# Patient Record
Sex: Female | Born: 1955 | Race: Black or African American | Hispanic: No | Marital: Married | Smoking: Never smoker
Health system: Southern US, Community
[De-identification: ages and names within clinical notes are randomized; demographics above are authoritative.]

## PROBLEM LIST (undated history)

## (undated) DIAGNOSIS — D126 Benign neoplasm of colon, unspecified: Secondary | ICD-10-CM

## (undated) DIAGNOSIS — I10 Essential (primary) hypertension: Secondary | ICD-10-CM

## (undated) DIAGNOSIS — M1990 Unspecified osteoarthritis, unspecified site: Secondary | ICD-10-CM

## (undated) DIAGNOSIS — T7840XA Allergy, unspecified, initial encounter: Secondary | ICD-10-CM

## (undated) DIAGNOSIS — E785 Hyperlipidemia, unspecified: Secondary | ICD-10-CM

## (undated) DIAGNOSIS — N920 Excessive and frequent menstruation with regular cycle: Secondary | ICD-10-CM

## (undated) DIAGNOSIS — N189 Chronic kidney disease, unspecified: Secondary | ICD-10-CM

## (undated) HISTORY — PX: POLYPECTOMY: SHX149

## (undated) HISTORY — DX: Chronic kidney disease, unspecified: N18.9

## (undated) HISTORY — DX: Essential (primary) hypertension: I10

## (undated) HISTORY — DX: Allergy, unspecified, initial encounter: T78.40XA

## (undated) HISTORY — DX: Benign neoplasm of colon, unspecified: D12.6

## (undated) HISTORY — DX: Unspecified osteoarthritis, unspecified site: M19.90

## (undated) HISTORY — PX: CYSTOSCOPY: SUR368

## (undated) HISTORY — DX: Excessive and frequent menstruation with regular cycle: N92.0

## (undated) HISTORY — DX: Hyperlipidemia, unspecified: E78.5

## (undated) HISTORY — PX: ABDOMINAL HYSTERECTOMY: SHX81

---

## 1998-02-08 ENCOUNTER — Ambulatory Visit (HOSPITAL_COMMUNITY): Admission: RE | Admit: 1998-02-08 | Discharge: 1998-02-08 | Payer: Self-pay | Admitting: Obstetrics & Gynecology

## 1998-02-27 ENCOUNTER — Encounter: Admission: RE | Admit: 1998-02-27 | Discharge: 1998-02-27 | Payer: Self-pay | Admitting: Obstetrics & Gynecology

## 1998-03-08 ENCOUNTER — Encounter: Admission: RE | Admit: 1998-03-08 | Discharge: 1998-03-08 | Payer: Self-pay | Admitting: Internal Medicine

## 1998-03-15 ENCOUNTER — Encounter: Admission: RE | Admit: 1998-03-15 | Discharge: 1998-03-15 | Payer: Self-pay | Admitting: Hematology and Oncology

## 1998-04-19 ENCOUNTER — Encounter: Admission: RE | Admit: 1998-04-19 | Discharge: 1998-04-19 | Payer: Self-pay | Admitting: Internal Medicine

## 1998-04-24 ENCOUNTER — Encounter: Admission: RE | Admit: 1998-04-24 | Discharge: 1998-04-24 | Payer: Self-pay | Admitting: Obstetrics & Gynecology

## 1998-05-17 ENCOUNTER — Encounter: Admission: RE | Admit: 1998-05-17 | Discharge: 1998-05-17 | Payer: Self-pay | Admitting: Internal Medicine

## 1998-05-31 ENCOUNTER — Encounter: Admission: RE | Admit: 1998-05-31 | Discharge: 1998-05-31 | Payer: Self-pay | Admitting: Hematology and Oncology

## 1998-06-19 ENCOUNTER — Encounter: Admission: RE | Admit: 1998-06-19 | Discharge: 1998-09-17 | Payer: Self-pay | Admitting: *Deleted

## 1998-07-05 ENCOUNTER — Encounter: Admission: RE | Admit: 1998-07-05 | Discharge: 1998-07-05 | Payer: Self-pay | Admitting: Internal Medicine

## 1998-08-14 ENCOUNTER — Encounter: Admission: RE | Admit: 1998-08-14 | Discharge: 1998-08-14 | Payer: Self-pay | Admitting: Hematology and Oncology

## 1998-12-18 ENCOUNTER — Ambulatory Visit (HOSPITAL_COMMUNITY): Admission: RE | Admit: 1998-12-18 | Discharge: 1998-12-18 | Payer: Self-pay | Admitting: Internal Medicine

## 1998-12-18 ENCOUNTER — Encounter: Admission: RE | Admit: 1998-12-18 | Discharge: 1998-12-18 | Payer: Self-pay | Admitting: Internal Medicine

## 1998-12-26 ENCOUNTER — Ambulatory Visit (HOSPITAL_COMMUNITY): Admission: RE | Admit: 1998-12-26 | Discharge: 1998-12-26 | Payer: Self-pay | Admitting: Internal Medicine

## 1999-01-17 ENCOUNTER — Encounter: Admission: RE | Admit: 1999-01-17 | Discharge: 1999-01-17 | Payer: Self-pay | Admitting: Internal Medicine

## 1999-02-21 ENCOUNTER — Ambulatory Visit (HOSPITAL_COMMUNITY): Admission: RE | Admit: 1999-02-21 | Discharge: 1999-02-21 | Payer: Self-pay | Admitting: Obstetrics & Gynecology

## 1999-05-02 ENCOUNTER — Encounter: Admission: RE | Admit: 1999-05-02 | Discharge: 1999-05-02 | Payer: Self-pay | Admitting: Obstetrics

## 1999-08-15 ENCOUNTER — Inpatient Hospital Stay (HOSPITAL_COMMUNITY): Admission: EM | Admit: 1999-08-15 | Discharge: 1999-08-16 | Payer: Self-pay | Admitting: Emergency Medicine

## 1999-08-15 ENCOUNTER — Encounter: Payer: Self-pay | Admitting: Emergency Medicine

## 1999-08-30 ENCOUNTER — Encounter: Admission: RE | Admit: 1999-08-30 | Discharge: 1999-08-30 | Payer: Self-pay | Admitting: Internal Medicine

## 2000-02-07 ENCOUNTER — Encounter: Admission: RE | Admit: 2000-02-07 | Discharge: 2000-02-07 | Payer: Self-pay | Admitting: Hematology and Oncology

## 2000-03-13 ENCOUNTER — Encounter: Admission: RE | Admit: 2000-03-13 | Discharge: 2000-03-13 | Payer: Self-pay | Admitting: Internal Medicine

## 2000-10-21 ENCOUNTER — Encounter: Admission: RE | Admit: 2000-10-21 | Discharge: 2000-10-21 | Payer: Self-pay | Admitting: Internal Medicine

## 2001-10-28 ENCOUNTER — Encounter: Admission: RE | Admit: 2001-10-28 | Discharge: 2001-10-28 | Payer: Self-pay | Admitting: Internal Medicine

## 2002-04-29 ENCOUNTER — Encounter: Admission: RE | Admit: 2002-04-29 | Discharge: 2002-04-29 | Payer: Self-pay | Admitting: Internal Medicine

## 2002-07-21 ENCOUNTER — Other Ambulatory Visit: Admission: RE | Admit: 2002-07-21 | Discharge: 2002-07-21 | Payer: Self-pay | Admitting: Obstetrics and Gynecology

## 2002-08-04 ENCOUNTER — Ambulatory Visit (HOSPITAL_COMMUNITY): Admission: RE | Admit: 2002-08-04 | Discharge: 2002-08-04 | Payer: Self-pay | Admitting: Obstetrics and Gynecology

## 2002-08-04 ENCOUNTER — Encounter: Payer: Self-pay | Admitting: Obstetrics and Gynecology

## 2002-09-01 ENCOUNTER — Encounter (INDEPENDENT_AMBULATORY_CARE_PROVIDER_SITE_OTHER): Payer: Self-pay

## 2002-09-01 ENCOUNTER — Ambulatory Visit (HOSPITAL_COMMUNITY): Admission: RE | Admit: 2002-09-01 | Discharge: 2002-09-01 | Payer: Self-pay | Admitting: Obstetrics and Gynecology

## 2002-12-07 ENCOUNTER — Encounter: Admission: RE | Admit: 2002-12-07 | Discharge: 2002-12-07 | Payer: Self-pay | Admitting: Internal Medicine

## 2003-04-19 ENCOUNTER — Encounter: Admission: RE | Admit: 2003-04-19 | Discharge: 2003-04-19 | Payer: Self-pay | Admitting: Internal Medicine

## 2003-11-18 DIAGNOSIS — D126 Benign neoplasm of colon, unspecified: Secondary | ICD-10-CM

## 2003-11-18 HISTORY — DX: Benign neoplasm of colon, unspecified: D12.6

## 2004-01-26 ENCOUNTER — Encounter: Admission: RE | Admit: 2004-01-26 | Discharge: 2004-01-26 | Payer: Self-pay | Admitting: Internal Medicine

## 2004-02-01 ENCOUNTER — Encounter: Admission: RE | Admit: 2004-02-01 | Discharge: 2004-02-01 | Payer: Self-pay | Admitting: Internal Medicine

## 2004-05-31 ENCOUNTER — Encounter: Admission: RE | Admit: 2004-05-31 | Discharge: 2004-05-31 | Payer: Self-pay | Admitting: Internal Medicine

## 2004-06-14 ENCOUNTER — Ambulatory Visit: Payer: Self-pay | Admitting: Internal Medicine

## 2004-06-14 ENCOUNTER — Encounter: Admission: RE | Admit: 2004-06-14 | Discharge: 2004-06-14 | Payer: Self-pay | Admitting: Internal Medicine

## 2004-07-03 ENCOUNTER — Other Ambulatory Visit: Admission: RE | Admit: 2004-07-03 | Discharge: 2004-07-03 | Payer: Self-pay | Admitting: Obstetrics and Gynecology

## 2004-07-09 ENCOUNTER — Ambulatory Visit (HOSPITAL_COMMUNITY): Admission: RE | Admit: 2004-07-09 | Discharge: 2004-07-09 | Payer: Self-pay | Admitting: Obstetrics and Gynecology

## 2004-10-07 ENCOUNTER — Ambulatory Visit (HOSPITAL_COMMUNITY): Admission: RE | Admit: 2004-10-07 | Discharge: 2004-10-07 | Payer: Self-pay | Admitting: Internal Medicine

## 2004-10-07 ENCOUNTER — Ambulatory Visit: Payer: Self-pay | Admitting: Internal Medicine

## 2005-06-09 IMAGING — CR DG FOOT COMPLETE 3+V*R*
3 series · 3 of 3 positions shown · non-contrast
Comparison: none

CLINICAL DATA: pain in right heel.
 RIGHT FOOT-COMPLETE:
 Three views of the right foot reveal degenerative changes without evidence of fracture or other acute abnormality.  A prominent inferior calcaneal spur is noted. There are arterial calcifications noted involving both the dorsalis pedis and the posterior tibial arteries.

[view not recorded (1 of 3)]
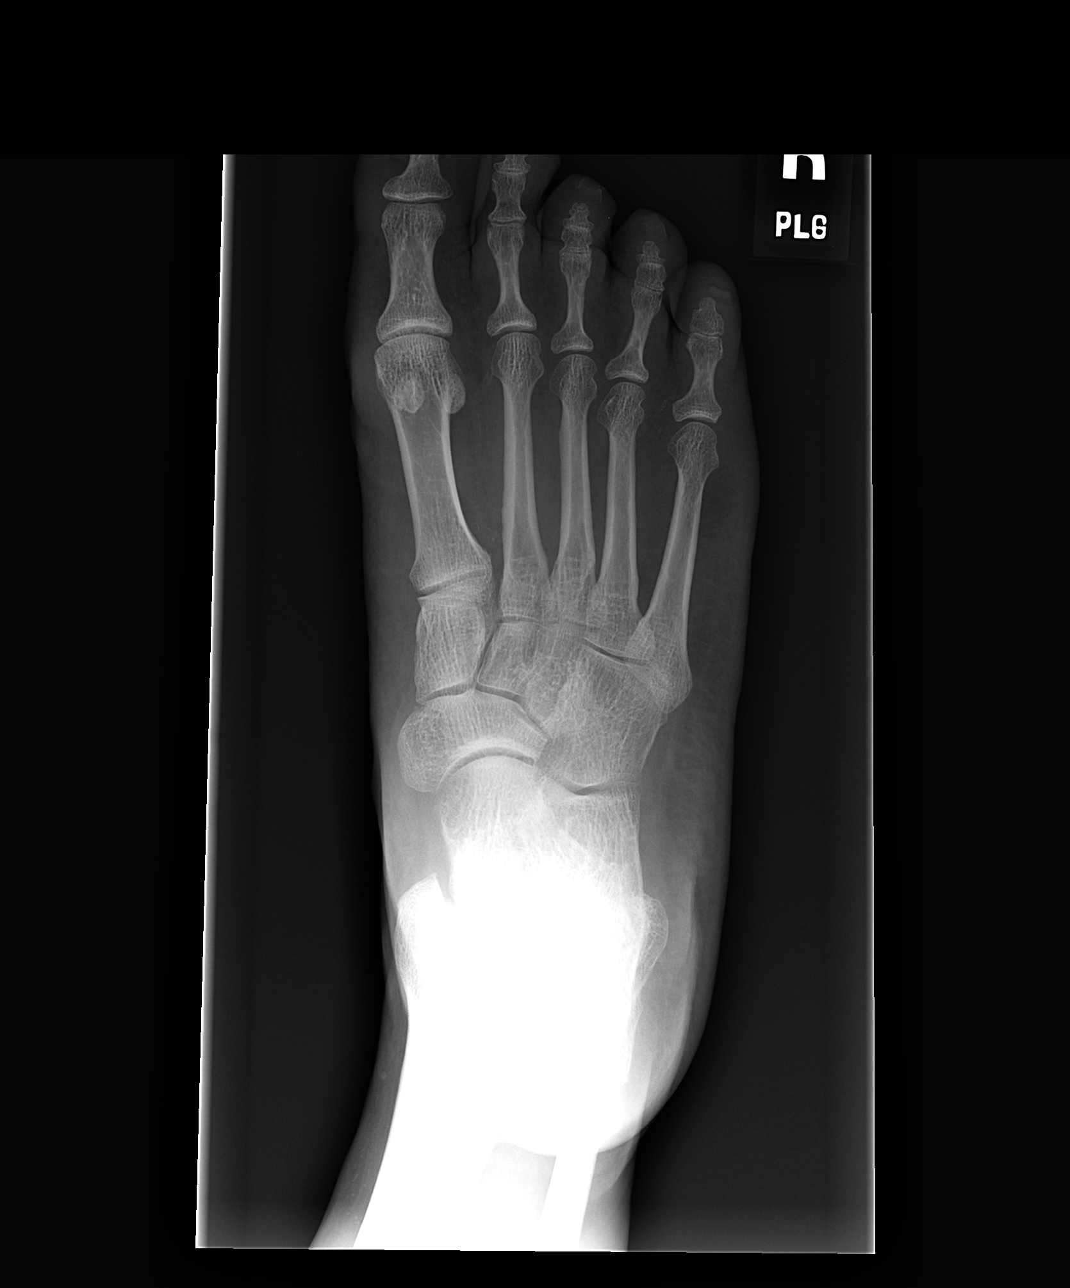

[view not recorded (2 of 3)]
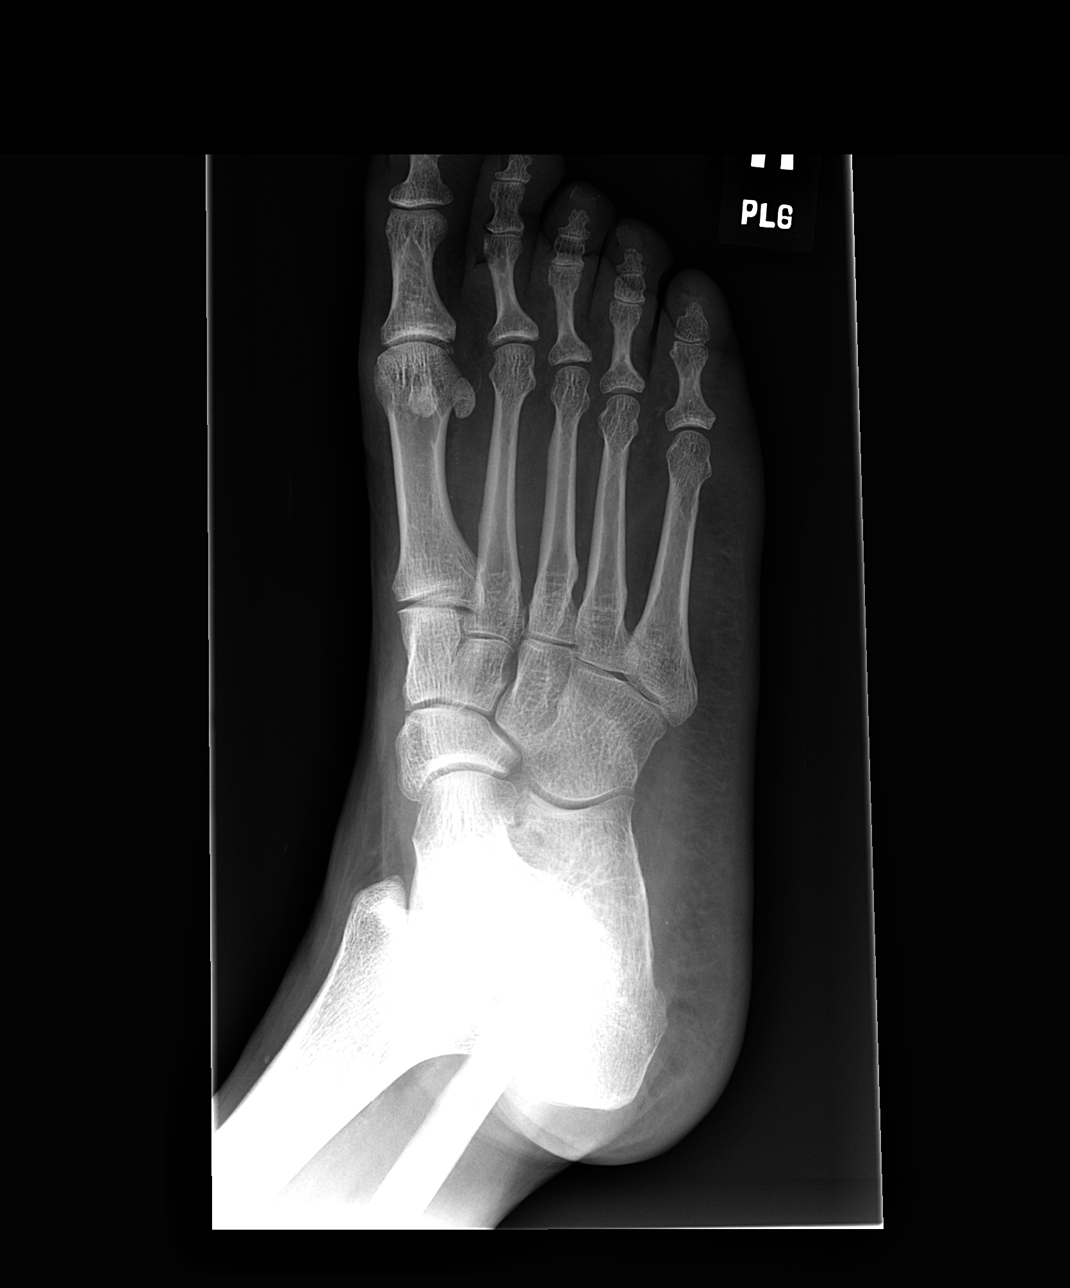

[view not recorded (3 of 3)]
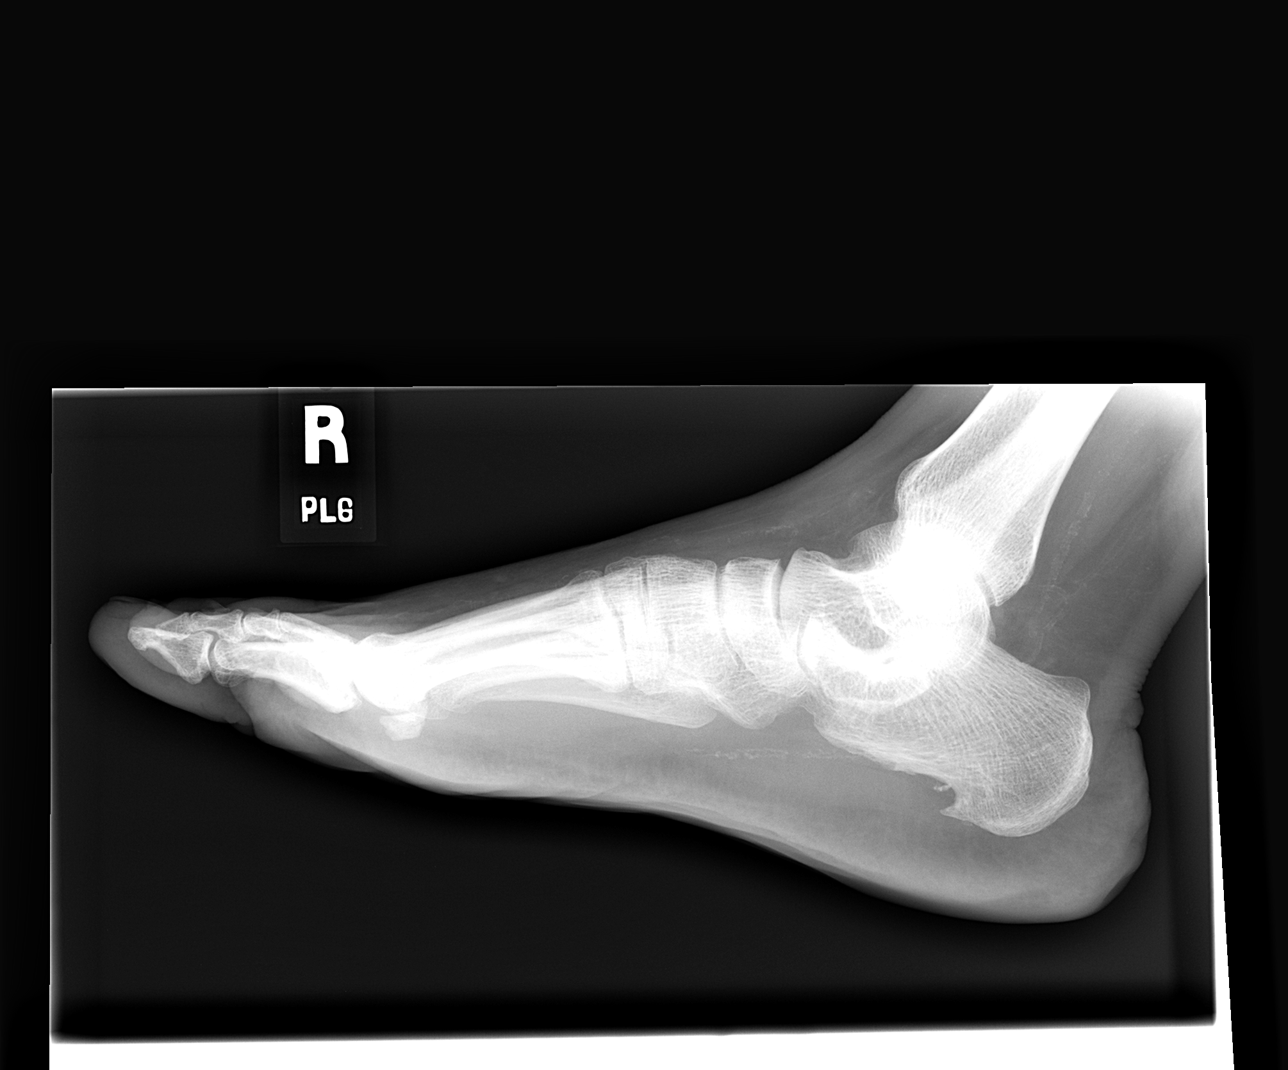

[3 of 3 positions shown; findings below may reference images not displayed]

IMPRESSION: Degenerative changes are noted with prominent inferior calcaneal spur, but without acute bony abnormality.  Arterial calcifications are noted.

## 2005-09-24 ENCOUNTER — Other Ambulatory Visit: Admission: RE | Admit: 2005-09-24 | Discharge: 2005-09-24 | Payer: Self-pay | Admitting: Obstetrics and Gynecology

## 2005-10-23 ENCOUNTER — Ambulatory Visit (HOSPITAL_COMMUNITY): Admission: RE | Admit: 2005-10-23 | Discharge: 2005-10-23 | Payer: Self-pay | Admitting: Obstetrics and Gynecology

## 2006-01-06 ENCOUNTER — Ambulatory Visit: Payer: Self-pay | Admitting: Internal Medicine

## 2006-01-14 ENCOUNTER — Ambulatory Visit: Payer: Self-pay | Admitting: Internal Medicine

## 2006-02-10 ENCOUNTER — Ambulatory Visit: Payer: Self-pay | Admitting: Internal Medicine

## 2006-11-11 ENCOUNTER — Ambulatory Visit (HOSPITAL_COMMUNITY): Admission: RE | Admit: 2006-11-11 | Discharge: 2006-11-11 | Payer: Self-pay | Admitting: Obstetrics and Gynecology

## 2007-04-07 ENCOUNTER — Encounter (INDEPENDENT_AMBULATORY_CARE_PROVIDER_SITE_OTHER): Payer: Self-pay | Admitting: Internal Medicine

## 2007-04-07 ENCOUNTER — Ambulatory Visit: Payer: Self-pay | Admitting: Hospitalist

## 2007-04-07 DIAGNOSIS — I119 Hypertensive heart disease without heart failure: Secondary | ICD-10-CM | POA: Insufficient documentation

## 2007-04-07 DIAGNOSIS — I10 Essential (primary) hypertension: Secondary | ICD-10-CM | POA: Insufficient documentation

## 2007-04-07 DIAGNOSIS — N920 Excessive and frequent menstruation with regular cycle: Secondary | ICD-10-CM | POA: Insufficient documentation

## 2007-04-07 DIAGNOSIS — E785 Hyperlipidemia, unspecified: Secondary | ICD-10-CM | POA: Insufficient documentation

## 2007-04-07 DIAGNOSIS — E119 Type 2 diabetes mellitus without complications: Secondary | ICD-10-CM | POA: Insufficient documentation

## 2007-04-08 ENCOUNTER — Encounter (INDEPENDENT_AMBULATORY_CARE_PROVIDER_SITE_OTHER): Payer: Self-pay | Admitting: Internal Medicine

## 2007-04-08 ENCOUNTER — Ambulatory Visit: Payer: Self-pay | Admitting: Internal Medicine

## 2007-04-08 LAB — CONVERTED CEMR LAB
Creatinine, Urine: 149.4 mg/dL
Microalb, Ur: 3.53 mg/dL — ABNORMAL HIGH (ref 0.00–1.89)

## 2007-04-11 LAB — CONVERTED CEMR LAB
ALT: 16 units/L (ref 0–35)
BUN: 13 mg/dL (ref 6–23)
Calcium: 9.7 mg/dL (ref 8.4–10.5)
Cholesterol: 144 mg/dL (ref 0–200)
Creatinine, Ser: 0.7 mg/dL (ref 0.40–1.20)
Glucose, Bld: 226 mg/dL — ABNORMAL HIGH (ref 70–99)
HDL: 42 mg/dL (ref >39)
Hemoglobin: 12.5 g/dL (ref 12.0–15.0)
LDL Cholesterol: 72 mg/dL (ref 0–99)
Platelets: 242 10*3/uL (ref 150–400)
RBC: 5.22 M/uL — ABNORMAL HIGH (ref 3.87–5.11)
RDW: 15.8 % — ABNORMAL HIGH (ref 11.5–14.0)
Total Protein: 7.6 g/dL (ref 6.0–8.3)
Triglycerides: 150 mg/dL — ABNORMAL HIGH (ref <150)

## 2007-04-13 ENCOUNTER — Ambulatory Visit: Payer: Self-pay | Admitting: Internal Medicine

## 2007-04-13 LAB — CONVERTED CEMR LAB

## 2007-04-19 ENCOUNTER — Telehealth: Payer: Self-pay | Admitting: *Deleted

## 2007-05-19 ENCOUNTER — Encounter (INDEPENDENT_AMBULATORY_CARE_PROVIDER_SITE_OTHER): Payer: Self-pay | Admitting: Internal Medicine

## 2007-05-19 ENCOUNTER — Ambulatory Visit: Payer: Self-pay | Admitting: Internal Medicine

## 2007-05-20 ENCOUNTER — Encounter (INDEPENDENT_AMBULATORY_CARE_PROVIDER_SITE_OTHER): Payer: Self-pay | Admitting: Obstetrics and Gynecology

## 2007-05-20 ENCOUNTER — Inpatient Hospital Stay (HOSPITAL_COMMUNITY): Admission: RE | Admit: 2007-05-20 | Discharge: 2007-05-21 | Payer: Self-pay | Admitting: Obstetrics and Gynecology

## 2007-09-29 ENCOUNTER — Telehealth (INDEPENDENT_AMBULATORY_CARE_PROVIDER_SITE_OTHER): Payer: Self-pay | Admitting: *Deleted

## 2007-10-21 ENCOUNTER — Ambulatory Visit: Payer: Self-pay | Admitting: Internal Medicine

## 2007-10-21 DIAGNOSIS — J309 Allergic rhinitis, unspecified: Secondary | ICD-10-CM | POA: Insufficient documentation

## 2007-10-21 DIAGNOSIS — D509 Iron deficiency anemia, unspecified: Secondary | ICD-10-CM | POA: Insufficient documentation

## 2007-10-25 ENCOUNTER — Ambulatory Visit: Payer: Self-pay | Admitting: Internal Medicine

## 2007-10-25 ENCOUNTER — Encounter (INDEPENDENT_AMBULATORY_CARE_PROVIDER_SITE_OTHER): Payer: Self-pay | Admitting: Internal Medicine

## 2007-10-26 LAB — CONVERTED CEMR LAB
Cholesterol: 187 mg/dL (ref 0–200)
HDL: 42 mg/dL (ref >39)
LDL Cholesterol: 121 mg/dL — ABNORMAL HIGH (ref 0–99)
VLDL: 24 mg/dL (ref 0–40)

## 2007-12-21 ENCOUNTER — Ambulatory Visit (HOSPITAL_COMMUNITY): Admission: RE | Admit: 2007-12-21 | Discharge: 2007-12-21 | Payer: Self-pay | Admitting: Obstetrics and Gynecology

## 2008-04-09 ENCOUNTER — Emergency Department (HOSPITAL_COMMUNITY): Admission: EM | Admit: 2008-04-09 | Discharge: 2008-04-09 | Payer: Self-pay | Admitting: Family Medicine

## 2008-11-07 ENCOUNTER — Telehealth: Payer: Self-pay | Admitting: Infectious Diseases

## 2008-12-12 ENCOUNTER — Ambulatory Visit: Payer: Self-pay | Admitting: Internal Medicine

## 2008-12-12 ENCOUNTER — Encounter (INDEPENDENT_AMBULATORY_CARE_PROVIDER_SITE_OTHER): Payer: Self-pay | Admitting: Internal Medicine

## 2008-12-13 ENCOUNTER — Encounter (INDEPENDENT_AMBULATORY_CARE_PROVIDER_SITE_OTHER): Payer: Self-pay | Admitting: Internal Medicine

## 2008-12-13 LAB — CONVERTED CEMR LAB: Microalb Creat Ratio: 19.7 mg/g (ref 0.0–30.0)

## 2008-12-14 ENCOUNTER — Ambulatory Visit: Payer: Self-pay | Admitting: Internal Medicine

## 2008-12-14 ENCOUNTER — Encounter (INDEPENDENT_AMBULATORY_CARE_PROVIDER_SITE_OTHER): Payer: Self-pay | Admitting: Internal Medicine

## 2008-12-14 LAB — CONVERTED CEMR LAB: Hgb A1c MFr Bld: 9.7 %

## 2008-12-16 LAB — CONVERTED CEMR LAB
ALT: 12 units/L (ref 0–35)
AST: 17 units/L (ref 0–37)
Calcium: 9.1 mg/dL (ref 8.4–10.5)
HCT: 39.1 % (ref 36.0–46.0)
Hemoglobin: 12.4 g/dL (ref 12.0–15.0)
MCV: 74.5 fL — ABNORMAL LOW (ref 78.0–100.0)
Potassium: 4 meq/L (ref 3.5–5.3)
RBC: 5.25 M/uL — ABNORMAL HIGH (ref 3.87–5.11)
RDW: 15.7 % — ABNORMAL HIGH (ref 11.5–15.5)
Sodium: 143 meq/L (ref 135–145)
Total Bilirubin: 0.4 mg/dL (ref 0.3–1.2)
Total CHOL/HDL Ratio: 4.3
WBC: 5.4 10*3/uL (ref 4.0–10.5)

## 2008-12-29 ENCOUNTER — Ambulatory Visit (HOSPITAL_COMMUNITY): Admission: RE | Admit: 2008-12-29 | Discharge: 2008-12-29 | Payer: Self-pay | Admitting: Internal Medicine

## 2009-01-18 ENCOUNTER — Ambulatory Visit: Payer: Self-pay | Admitting: *Deleted

## 2009-01-18 LAB — CONVERTED CEMR LAB: Hgb A1c MFr Bld: 9.3 %

## 2009-10-23 ENCOUNTER — Encounter (INDEPENDENT_AMBULATORY_CARE_PROVIDER_SITE_OTHER): Payer: Self-pay | Admitting: Internal Medicine

## 2009-10-23 ENCOUNTER — Ambulatory Visit: Payer: Self-pay | Admitting: Internal Medicine

## 2009-10-23 ENCOUNTER — Ambulatory Visit (HOSPITAL_COMMUNITY): Admission: RE | Admit: 2009-10-23 | Discharge: 2009-10-23 | Payer: Self-pay | Admitting: Internal Medicine

## 2009-10-23 DIAGNOSIS — R079 Chest pain, unspecified: Secondary | ICD-10-CM | POA: Insufficient documentation

## 2009-10-23 DIAGNOSIS — M549 Dorsalgia, unspecified: Secondary | ICD-10-CM | POA: Insufficient documentation

## 2009-10-23 LAB — CONVERTED CEMR LAB
Blood Glucose, AC Bkfst: 151 mg/dL
Hgb A1c MFr Bld: 7.4 %

## 2009-10-24 ENCOUNTER — Encounter: Payer: Self-pay | Admitting: Internal Medicine

## 2009-10-24 LAB — CONVERTED CEMR LAB
AST: 14 units/L (ref 0–37)
Albumin: 4.2 g/dL (ref 3.5–5.2)
Alkaline Phosphatase: 67 units/L (ref 39–117)
Calcium: 9.4 mg/dL (ref 8.4–10.5)
Chloride: 107 meq/L (ref 96–112)
Glucose, Bld: 145 mg/dL — ABNORMAL HIGH (ref 70–99)
Hemoglobin: 11.4 g/dL — ABNORMAL LOW (ref 12.0–15.0)
LDL Cholesterol: 127 mg/dL — ABNORMAL HIGH (ref 0–99)
MCHC: 31 g/dL (ref 30.0–36.0)
MCV: 76.3 fL — ABNORMAL LOW (ref >=78.0)
Platelets: 249 10*3/uL (ref 150–400)
Potassium: 4.1 meq/L (ref 3.5–5.3)
RBC: 4.82 M/uL (ref 3.87–5.11)
RDW: 16.1 % — ABNORMAL HIGH (ref 11.5–15.5)
Sodium: 142 meq/L (ref 135–145)
Total CHOL/HDL Ratio: 4.3
Triglycerides: 79 mg/dL (ref <150)

## 2009-11-23 ENCOUNTER — Ambulatory Visit: Payer: Self-pay | Admitting: Cardiology

## 2009-11-28 ENCOUNTER — Telehealth (INDEPENDENT_AMBULATORY_CARE_PROVIDER_SITE_OTHER): Payer: Self-pay | Admitting: *Deleted

## 2009-11-29 ENCOUNTER — Encounter (HOSPITAL_COMMUNITY): Admission: RE | Admit: 2009-11-29 | Discharge: 2010-01-21 | Payer: Self-pay | Admitting: Cardiology

## 2009-11-29 ENCOUNTER — Ambulatory Visit: Payer: Self-pay | Admitting: Cardiology

## 2009-11-29 ENCOUNTER — Ambulatory Visit: Payer: Self-pay

## 2009-12-04 ENCOUNTER — Ambulatory Visit: Payer: Self-pay | Admitting: Internal Medicine

## 2009-12-04 LAB — CONVERTED CEMR LAB
OCCULT 1: NEGATIVE
OCCULT 3: NEGATIVE

## 2009-12-06 ENCOUNTER — Telehealth: Payer: Self-pay | Admitting: Cardiology

## 2010-01-10 ENCOUNTER — Ambulatory Visit: Payer: Self-pay | Admitting: Cardiology

## 2010-01-15 ENCOUNTER — Ambulatory Visit: Payer: Self-pay | Admitting: Cardiology

## 2010-01-15 LAB — CONVERTED CEMR LAB
BUN: 13 mg/dL (ref 6–23)
CO2: 29 meq/L (ref 19–32)
Chloride: 105 meq/L (ref 96–112)
Creatinine, Ser: 0.7 mg/dL (ref 0.4–1.2)
GFR calc non Af Amer: 111.28 mL/min (ref >60)
Glucose, Bld: 172 mg/dL — ABNORMAL HIGH (ref 70–99)
Potassium: 4.2 meq/L (ref 3.5–5.1)
Sodium: 141 meq/L (ref 135–145)

## 2010-01-24 ENCOUNTER — Telehealth (INDEPENDENT_AMBULATORY_CARE_PROVIDER_SITE_OTHER): Payer: Self-pay | Admitting: Internal Medicine

## 2010-01-25 ENCOUNTER — Telehealth (INDEPENDENT_AMBULATORY_CARE_PROVIDER_SITE_OTHER): Payer: Self-pay | Admitting: Internal Medicine

## 2010-01-29 ENCOUNTER — Ambulatory Visit (HOSPITAL_COMMUNITY): Admission: RE | Admit: 2010-01-29 | Discharge: 2010-01-29 | Payer: Self-pay | Admitting: Orthopedic Surgery

## 2010-03-21 ENCOUNTER — Ambulatory Visit: Payer: Self-pay | Admitting: Internal Medicine

## 2010-10-31 ENCOUNTER — Telehealth: Payer: Self-pay | Admitting: Internal Medicine

## 2010-12-19 NOTE — Progress Notes (Signed)
Summary: Refill/gh  Phone Note Refill Request Message from:  Patient on January 25, 2010 3:25 PM  Refills Requested: Medication #1:  GLUCOTROL XL 10 MG TB24 Take 1 tablet by mouth two times a day  Method Requested: Electronic Initial call taken by: Angelina Ok RN,  January 25, 2010 3:26 PM Caller: Patient Call For: Silvestre Gunner MD  Follow-up for Phone Call       Follow-up by: Silvestre Gunner MD,  January 28, 2010 8:58 AM    Prescriptions: GLUCOTROL XL 10 MG TB24 (GLIPIZIDE) Take 1 tablet by mouth two times a day  #62 x 11   Entered and Authorized by:   Silvestre Gunner MD   Signed by:   Silvestre Gunner MD on 01/28/2010   Method used:   Electronically to        [facility]* (retail)       [address]       [address] Shipping/mailing       [place], [place]  [address]       Ph: [phone]       Fax: [phone]   RxID:   [id number]

## 2010-12-19 NOTE — Assessment & Plan Note (Signed)
Summary: EST-ROUTINE CHECKUP/CH   Vital Signs:  Patient profile:   55 year old female Height:      60.5 inches (153.67 cm) Weight:      170.0 pounds (77.27 kg) BMI:     32.77 Temp:     97.5 degrees F Pulse rate:   69 / minute BP sitting:   124 / 74  (left arm)  Vitals Entered By: Dorie Rank RN (Mar 21, 2010 1:27 PM) CC: check up - need new cholesterol med she can afford - cut finger at work about 3 months ago - [facility] sent pt to hand specialist - due to have surgery in 2 weeks Is Patient Diabetic? Yes Did you bring your meter with you today? No Pain Assessment Patient in pain? no      Nutritional Status BMI of > 30 = obese CBG Result 162  Does patient need assistance? Functional Status Self care Ambulation Normal   Primary Care Provider:  Silvestre Gunner MD  CC:  check up - need new cholesterol med she can afford - cut finger at work about 3 months ago - [facility] sent pt to hand specialist - due to have surgery in 2 weeks.  History of Present Illness: Ms. Hoeger is a 55 yo F with PMH of DM, HTN, and HLD who presents for checkup and to have her cholesterol medication changed. She has been doing well but says that the Vytorin costs her $50 copay per month, which she cannot afford. She was worked up for chest pain by [facility] cardiology in January, and a stress test showed poor exercise tolerance without evidence of ischemia. She denies any further CP.  Preventive Screening-Counseling & Management  Alcohol-Tobacco     Alcohol drinks/day: <1     Alcohol type: ocasional     Smoking Status: never     Passive Smoke Exposure: no  Caffeine-Diet-Exercise     Does Patient Exercise: no     Exercise (avg: min/session): 4:30  Current Medications (verified): 1)  Lisinopril 40 Mg Tabs (Lisinopril) .... Take 1 Tablet By Mouth Once A Day 2)  Glucotrol Xl 10 Mg Tb24 (Glipizide) .... Take 1 Tablet By Mouth Two Times A Day 3)  Glucophage 1000 Mg Tabs (Metformin Hcl) .... Take 1 Tablet By  Mouth Two Times A Day 4)  Amlodipine Besylate 10 Mg Tabs (Amlodipine Besylate) .... Take One Tablet By Mouth Daily 5)  Accu-Chek Instant Plus Test  Strp (Glucose Blood) .... As Needed 6)  Crestor 20 Mg Tabs (Rosuvastatin Calcium) .... Take 1 Tablet Daily 7)  Aspirin 81 Mg Tbec (Aspirin) .... Take One Tablet By Mouth Daily 8)  Hydrochlorothiazide 12.5 Mg Tabs (Hydrochlorothiazide) .... Take One Tablet By Mouth Daily.  Allergies (verified): No Known Drug Allergies  Past History:  Past Medical History: Last updated: 11/23/2009 HYPERTENSION (ICD-401.9) HYPERLIPIDEMIA (ICD-272.4) ALLERGIC RHINITIS (ICD-477.9) ANEMIA-IRON DEFICIENCY (ICD-280.9) MENORRHAGIA (ICD-626.2) DIABETES MELLITUS, TYPE II (ICD-250.00)  Past Surgical History: Last updated: 11/23/2009  hysterectomy  Cystoscopy.   Family History: Last updated: 11/23/2009 No significant family history. No histrory of problem with anesthetic agents in the family.  No premature CAD  Social History: Last updated: 11/23/2009 Lives with husband. 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  Never Smoked Alcohol use-occasional Drug use-no  Risk Factors: Alcohol Use: <1 (03/21/2010) Exercise: no (03/21/2010)  Risk Factors: Smoking Status: never (03/21/2010) Passive Smoke Exposure: no (03/21/2010)  Review of Systems      See HPI  Physical Exam  General:  Well-developed,well-nourished,in no acute distress; alert,appropriate and cooperative throughout examination Head:  Normocephalic and atraumatic without obvious abnormalities. No apparent alopecia or balding. Lungs:  Normal respiratory effort, chest expands symmetrically. Lungs are clear to auscultation, no crackles or wheezes. Heart:  Normal rate and regular rhythm. S1 and S2 normal without gallop, murmur, click, rub or other extra sounds. Abdomen:  soft, non-tender, and no distention.   Neurologic:  alert & oriented X3.   Psych:  pleasant though she became a bit  defensive when I asked if she would be willing to see Jamison Neighbor, appears depressed   Impression & Recommendations:  Problem # 1:  DIABETES MELLITUS, TYPE II (ICD-250.00) Her A1c today is 8.0, which is higher than her value in 12/10, which was 7.3. Pt says she stays away from sweets but does admit to eating rice daily as well as cornmeal with meals (as is customary in African foods, per pt). She is upset that her A1c has worsened and was tearful during the interview; however, she completely rebuffed any suggestion that she meet with Jamison Neighbor, with whom she has not met in 2 years. I talked to her about her diet and encouraged her to meet with Lupita Leash as I am afraid if things keep going on the same path that she may one day require insulin. She immediately shook her head and said "no, I'm not taking insulin" but continued to decline seeing Ms. Victory Dakin. This will need to be readdressed at her next visit depending on her next A1c. I have printed out information from the internet for her regarding diabetic diet.  Her updated medication list for this problem includes:    Lisinopril 40 Mg Tabs (Lisinopril) .Marland Kitchen... Take 1 tablet by mouth once a day    Glucotrol Xl 10 Mg Tb24 (Glipizide) .Marland Kitchen... Take 1 tablet by mouth two times a day    Glucophage 1000 Mg Tabs (Metformin hcl) .Marland Kitchen... Take 1 tablet by mouth two times a day    Aspirin 81 Mg Tbec (Aspirin) .Marland Kitchen... Take one tablet by mouth daily  Orders: T-Hgb A1C (in-house) (09811BJ) T- Capillary Blood Glucose (47829)  Problem # 2:  HYPERLIPIDEMIA (ICD-272.4) She cannot afford Vytorin and has only taken simvastatin in the past. I will switch her to Crestor, as it's a great medication and her pharmacy said the copay for this is only $7. I would recommend she get a Cmet at her next visit in 3 mos.   Her updated medication list for this problem includes:    Crestor 20 Mg Tabs (Rosuvastatin calcium) .Marland Kitchen... Take 1 tablet daily  Problem # 3:  CHEST PAIN, EXERTIONAL  (ICD-786.50) Pt denies any chest pain. She was worked up in January with a stress test and found to have poor exercise tolerance without evidence of ischemia. Continue with BP and lipid control.  Complete Medication List: 1)  Lisinopril 40 Mg Tabs (Lisinopril) .... Take 1 tablet by mouth once a day 2)  Glucotrol Xl 10 Mg Tb24 (Glipizide) .... Take 1 tablet by mouth two times a day 3)  Glucophage 1000 Mg Tabs (Metformin hcl) .... Take 1 tablet by mouth two times a day 4)  Amlodipine Besylate 10 Mg Tabs (Amlodipine besylate) .... Take one tablet by mouth daily 5)  Accu-chek Instant Plus Test Strp (Glucose blood) .... As needed 6)  Crestor 20 Mg Tabs (Rosuvastatin calcium) .... Take 1 tablet daily 7)  Aspirin 81 Mg Tbec (Aspirin) .... Take one tablet by mouth daily 8)  Hydrochlorothiazide 12.5  Mg Tabs (Hydrochlorothiazide) .... Take one tablet by mouth daily.  Patient Instructions: 1)  Please schedule a follow-up appointment in 3 months. 2)  I have changed your cholesterol medication. Please stop taking the Vytorin and instead take Crestor as directed. A refill has been sent to your pharmacy. 3)  I have printed out information for you regarding the diet for the diabetic patient. Let us know if you have any questions regarding this! Please consider scheduling an appointment with Jamison Neighbor in the future. Prescriptions: CRESTOR 20 MG TABS (ROSUVASTATIN CALCIUM) take 1 tablet daily  #30 x 5   Entered and Authorized by:   Silvestre Gunner MD   Signed by:   Silvestre Gunner MD on 03/21/2010   Method used:   Electronically to        [facility]* (retail)       [address]       [address] Shipping/mailing       [place], [place]  [address]       Ph: [phone]       Fax: [phone]   RxID:   [id number]   Prevention & Chronic Care Immunizations   Influenza vaccine: Not documented   Influenza vaccine due: 07/18/2010    Tetanus booster: Not documented   Td booster  deferral: Refused  (10/23/2009)    Pneumococcal vaccine: Not documented   Pneumococcal vaccine deferral: Refused  (10/23/2009)  Colorectal Screening   Hemoccult: Not documented   Hemoccult action/deferral: Ordered  (10/23/2009)    Colonoscopy: Not documented  Other Screening   Pap smear: Not documented    Mammogram: ASSESSMENT: Negative - BI-RADS 1^MM DIGITAL SCREENING  (01/29/2010)   Mammogram due: 01/30/2011   Smoking status: never  (03/21/2010)  Diabetes Mellitus   HgbA1C: 8.0  (03/21/2010)   HgbA1C action/deferral: Ordered  (03/21/2010)    Eye exam: Not documented    Foot exam: yes  (10/23/2009)   High risk foot: No  (01/18/2009)   Foot care education: Not documented    Urine microalbumin/creatinine ratio: 19.7  (12/13/2008)  Lipids   Total Cholesterol: 187  (10/24/2009)   LDL: 127  (10/24/2009)   LDL Direct: Not documented   HDL: 44  (10/24/2009)   Triglycerides: 79  (10/24/2009)    SGOT (AST): 14  (10/24/2009)   SGPT (ALT): 10  (10/24/2009)   Alkaline phosphatase: 67  (10/24/2009)   Total bilirubin: 0.4  (10/24/2009)  Hypertension   Last Blood Pressure: 124 / 74  (03/21/2010)   Serum creatinine: 0.7  (01/15/2010)   Serum potassium 4.2  (01/15/2010)  Self-Management Support :   Personal Goals (by the next clinic visit) :     Personal A1C goal: 7  (03/21/2010)     Personal blood pressure goal: 130/80  (03/21/2010)     Personal LDL goal: 100  (03/21/2010)    Patient will work on the following items until the next clinic visit to reach self-care goals:     Medications and monitoring: take my medicines every day, check my blood sugar, bring all of my medications to every visit, examine my feet every day  (03/21/2010)     Eating: drink diet soda or water instead of juice or soda, eat more vegetables, eat foods that are low in salt, eat baked foods instead of fried foods, eat fruit for snacks and desserts  (03/21/2010)     Activity: take a 30 minute walk every  day  (03/21/2010)     Other:  using treadmill  (03/21/2010)    Diabetes self-management support: Pre-printed educational material, Written self-care plan, Resources for patients handout  (03/21/2010)   Diabetes care plan printed    Hypertension self-management support: Written self-care plan, Pre-printed educational material, Resources for patients handout  (03/21/2010)   Hypertension self-care plan printed.    Lipid self-management support: Written self-care plan, Pre-printed educational material, Resources for patients handout  (03/21/2010)   Lipid self-care plan printed.      Resource handout printed.   Nursing Instructions: HgbA1C today (see order) CBG today (see order)     Laboratory Results   Blood Tests   Date/Time Received: Mar 21, 2010 1:56 PM Date/Time Reported: Alric Quan  Mar 21, 2010 1:56 PM   HGBA1C: 8.0%   (Normal Range: Non-Diabetic - 3-6%   Control Diabetic - 6-8%) CBG Random:: 162mg /dL

## 2010-12-19 NOTE — Progress Notes (Signed)
Summary: Nuclear Pre-Procedure  Phone Note Outgoing Call   Call placed by: Milana Na, EMT-P,  November 28, 2009 2:23 PM Summary of Call: Left message with information on Myoview Information Sheet (see scanned document for details).      Nuclear Med Background Indications for Stress Test: Evaluation for Ischemia     Symptoms: Chest Pressure with Exertion    Nuclear Pre-Procedure Cardiac Risk Factors: Hypertension, Lipids, NIDDM Height (in): 60  Nuclear Med Study Referring MD:  B.Crenshaw

## 2010-12-19 NOTE — Progress Notes (Signed)
Summary: returning call  Phone Note Call from Patient Call back at Home Phone [phone]   Caller: Patient Reason for Call: Talk to Nurse Summary of Call: returning call  Initial call taken by: Migdalia Dk,  December 06, 2009 4:14 PM  Follow-up for Phone Call        Spoke with patient. Stress test result given. Ollen Gross, RN, BSN  December 06, 2009 4:39 PM

## 2010-12-19 NOTE — Assessment & Plan Note (Signed)
Summary: F/U CARDIOLITE/D.MILLER   Primary Provider:  Silvestre Gunner MD  CC:  follow up stres test.  History of Present Illness: Pleasant female I recently saw on November 23, 2009 for chest pain. We were concerned about her symptoms and discussed cardiac catheterization. However she preferred a functional study first. She therefore had a Myoview performed in January of 2011. This showed apical thinning but no ischemia. The ejection fraction was 60%. She did have a hypertensive response and poor functional capacity. Since then the patient denies any dyspnea on exertion, orthopnea, PND, pedal edema, palpitations, syncope or chest pain.   Current Medications (verified): 1)  Lisinopril 40 Mg Tabs (Lisinopril) .... Take 1 Tablet By Mouth Once A Day 2)  Glucotrol Xl 10 Mg Tb24 (Glipizide) .... Take 1 Tablet By Mouth Two Times A Day 3)  Glucophage 1000 Mg Tabs (Metformin Hcl) .... Take 1 Tablet By Mouth Two Times A Day 4)  Amlodipine Besylate 10 Mg Tabs (Amlodipine Besylate) .... Take One Tablet By Mouth Daily 5)  Accu-Chek Instant Plus Test  Strp (Glucose Blood) .... As Needed 6)  Vytorin 10-40 Mg Tabs (Ezetimibe-Simvastatin) .... Once Daily 7)  Aspirin 81 Mg Tbec (Aspirin) .... Take One Tablet By Mouth Daily 8)  Hydrochlorothiazide 12.5 Mg Tabs (Hydrochlorothiazide) .... Take One Tablet By Mouth Daily.  Allergies: No Known Drug Allergies  Past History:  Past Medical History: Reviewed history from 11/23/2009 and no changes required. HYPERTENSION (ICD-401.9) HYPERLIPIDEMIA (ICD-272.4) ALLERGIC RHINITIS (ICD-477.9) ANEMIA-IRON DEFICIENCY (ICD-280.9) MENORRHAGIA (ICD-626.2) DIABETES MELLITUS, TYPE II (ICD-250.00)  Past Surgical History: Reviewed history from 11/23/2009 and no changes required.  hysterectomy  Cystoscopy.   Social History: Reviewed history from 11/23/2009 and no changes required. Lives with husband. 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 Remaining systems are negative.   Vital Signs:  Patient profile:   55 year old female Height:      60 inches Weight:      175 pounds BMI:     34.30 Pulse rate:   68 / minute Resp:     12 per minute BP sitting:   164 / 78  (left arm)  Vitals Entered By: Kem Parkinson (January 10, 2010 8:51 AM)  Physical Exam  General:  Well-developed well-nourished in no acute distress.  Skin is warm and dry.  HEENT is normal.  Neck is supple. No thyromegaly.  Chest is clear to auscultation with normal expansion.  Cardiovascular exam is regular rate and rhythm.  Abdominal exam nontender or distended. No masses palpated. Extremities show no edema. neuro grossly intact    Impression & Recommendations:  Problem # 1:  CHEST PAIN, EXERTIONAL (ICD-786.50) Patient has had no further chest pain. Her Myoview shows no ischemia. We will continue with medical therapy including her aspirin, ACE inhibitor and statin. I will have a low threshold for cardiac catheterization in the future if she is agreeable or has worsening symptoms. Her updated medication list for this problem includes:    Lisinopril 40 Mg Tabs (Lisinopril) .Marland Kitchen... Take 1 tablet by mouth once a day    Amlodipine Besylate 10 Mg Tabs (Amlodipine besylate) .Marland Kitchen... Take one tablet by mouth daily    Aspirin 81 Mg Tbec (Aspirin) .Marland Kitchen... Take one tablet by mouth daily  Problem # 2:  HYPERTENSION (ICD-401.9)  Her updated medication list for this problem includes:    Lisinopril 40 Mg Tabs (Lisinopril) .Marland Kitchen... Take 1 tablet by mouth once a day    Amlodipine Besylate 10 Mg Tabs (Amlodipine besylate) .Marland Kitchen... Take one tablet by mouth daily    Aspirin 81 Mg Tbec (Aspirin) .Marland Kitchen... Take one tablet by mouth daily     Hydrochlorothiazide 12.5 Mg Tabs (Hydrochlorothiazide) .Marland Kitchen... Take one tablet by mouth daily.  Her updated medication list for this problem includes:    Lisinopril 40 Mg Tabs (Lisinopril) .Marland Kitchen... Take 1 tablet by mouth once a day    Amlodipine Besylate 10 Mg Tabs (Amlodipine besylate) .Marland Kitchen... Take one tablet by mouth daily    Aspirin 81 Mg Tbec (Aspirin) .Marland Kitchen... Take one tablet by mouth daily  Problem # 3:  HYPERLIPIDEMIA (ICD-272.4)  Continue statin. Lipids and liver monitored by primary care. The following medications were removed from the medication list:    Zocor 40 Mg Tabs (Simvastatin) .Marland Kitchen... Take 1 tablet by mouth at bedtime Her updated medication list for this problem includes:    Vytorin 10-40 Mg Tabs (Ezetimibe-simvastatin) ..... Once daily  The following medications were removed from the medication list:    Zocor 40 Mg Tabs (Simvastatin) .Marland Kitchen... Take 1 tablet by mouth at bedtime Her updated medication list for this problem includes:    Vytorin 10-40 Mg Tabs (Ezetimibe-simvastatin) ..... Once daily  Problem # 4:  DIABETES MELLITUS, TYPE II (ICD-250.00)  Management per primary care. Her updated medication list for this problem includes:    Lisinopril 40 Mg Tabs (Lisinopril) .Marland Kitchen... Take 1 tablet by mouth once a day    Glucotrol Xl 10 Mg Tb24 (Glipizide) .Marland Kitchen... Take 1 tablet by mouth two times a day    Glucophage 1000 Mg Tabs (Metformin hcl) .Marland Kitchen... Take 1 tablet by mouth two times a day    Aspirin 81 Mg Tbec (Aspirin) .Marland Kitchen... Take one tablet by mouth daily  Her updated medication list for this problem includes:    Lisinopril 40 Mg Tabs (Lisinopril) .Marland Kitchen... Take 1 tablet by mouth once a day    Glucotrol Xl 10 Mg Tb24 (Glipizide) .Marland Kitchen... Take 1 tablet by mouth two times a day    Glucophage 1000 Mg Tabs (Metformin hcl) .Marland Kitchen... Take 1 tablet by mouth two times a day    Aspirin 81 Mg Tbec (Aspirin) .Marland Kitchen... Take one tablet by mouth daily  Patient Instructions: 1)  Your physician recommends that you  schedule a follow-up appointment in: 6 months 2)  Your physician recommends that you return for lab work in:one week--bmet 3)  Your physician has recommended you make the following change in your medication: please start hydrochlorothiazide 1 tablet everyday Prescriptions: HYDROCHLOROTHIAZIDE 12.5 MG TABS (HYDROCHLOROTHIAZIDE) Take one tablet by mouth daily.  #30 x 10   Entered by:   Ledon Snare, RN   Authorized by:   Ferman Hamming, MD, [facility]   Signed by:   Ledon Snare, RN on 01/10/2010   Method used:   Electronically to        [facility]* (retail)       [address]       [address] Shipping/mailing       [place], [place]  [address]       Ph: [phone]       Fax: [phone]   RxID:   [id number]

## 2010-12-19 NOTE — Progress Notes (Signed)
Summary: refill/gg  Phone Note Refill Request  on January 24, 2010 11:03 AM  Refills Requested: Medication #1:  LISINOPRIL 40 MG TABS Take 1 tablet by mouth once a day   Last Refilled: 10/19/2009  Medication #2:  GLUCOPHAGE 1000 MG TABS Take 1 tablet by mouth two times a day   Last Refilled: 10/22/2009  Method Requested: Electronic Initial call taken by: Merrie Roof RN,  January 24, 2010 11:03 AM    Prescriptions: GLUCOPHAGE 1000 MG TABS (METFORMIN HCL) Take 1 tablet by mouth two times a day  #62 x 11   Entered and Authorized by:   Silvestre Gunner MD   Signed by:   Silvestre Gunner MD on 01/24/2010   Method used:   Electronically to        [facility]* (retail)       [address]       [address] Shipping/mailing       [place], [place]  [address]       Ph: [phone]       Fax: [phone]   RxID:   [id number] LISINOPRIL 40 MG TABS (LISINOPRIL) Take 1 tablet by mouth once a day  #31 x 11   Entered and Authorized by:   Silvestre Gunner MD   Signed by:   Silvestre Gunner MD on 01/24/2010   Method used:   Electronically to        [facility]* (retail)       [address]       [address] Shipping/mailing       [place], [place]  [address]       Ph: [phone]       Fax: [phone]   RxID:   [id number]

## 2010-12-19 NOTE — Miscellaneous (Signed)
Summary: Orders Update  Clinical Lists Changes  Orders: Added new Test order of TLB-BMP (Basic Metabolic Panel-BMET) (80048-METABOL) - Signed 

## 2010-12-19 NOTE — Progress Notes (Signed)
Summary: Refill/gh  Phone Note Refill Request Message from:  Fax from Pharmacy on October 31, 2010 10:17 AM  Refills Requested: Medication #1:  CRESTOR 20 MG TABS take 1 tablet daily   Dosage confirmed as above?Dosage Confirmed   Last Refilled: 10/30/2010 Needs # 90.   Method Requested: Electronic Initial call taken by: Angelina Ok RN,  October 31, 2010 10:17 AM  Follow-up for Phone Call        Rx completed in Dr. Tiajuana Amass Follow-up by: Jackson Latino MD,  November 04, 2010 11:34 AM    Prescriptions: CRESTOR 20 MG TABS (ROSUVASTATIN CALCIUM) take 1 tablet daily  #90 x 3   Entered and Authorized by:   Jackson Latino MD   Signed by:   Jackson Latino MD on 11/04/2010   Method used:   Electronically to        [facility]* (retail)       [address]       [address] Shipping/mailing       [place], [place]  [address]       Ph: [phone]       Fax: [phone]   RxID:   [id number]

## 2010-12-19 NOTE — Assessment & Plan Note (Signed)
Summary: Cardiology Nuclear Study  Nuclear Med Background Indications for Stress Test: Evaluation for Ischemia    History Comments: NO DOCUMENTED CAD  Symptoms: Chest Pressure with Exertion  Symptoms Comments: Last episode of YN:WGNF month.   Nuclear Pre-Procedure Cardiac Risk Factors: Hypertension, Lipids, NIDDM, Obesity Caffeine/Decaff Intake: None NPO After: 10:30 PM Lungs: Clear IV 0.9% NS with Angio Cath: 22g     IV Site: (R) AC IV Started by: Irean Hong RN Chest Size (in) 40     Cup Size DD+     Height (in): 60 Weight (lb): 170 BMI: 33.32 Tech Comments: No medications taken today.  Nuclear Med Study 1 or 2 day study:  1 day     Stress Test Type:  Stress Reading MD:  Marca Ancona, MD     Referring MD:  Olga Millers, MD; AO:ZHYQMV Riofrio, MD Resting Radionuclide:  Technetium 22m Tetrofosmin     Resting Radionuclide Dose:  10.0 mCi  Stress Radionuclide:  Technetium 27m Tetrofosmin     Stress Radionuclide Dose:  33.0 mCi   Stress Protocol Exercise Time (min):  4:30 min     Max HR:  157 bpm     Predicted Max HR:  165 bpm  Max Systolic BP: 212 mm Hg     Percent Max HR:  95.15 %     METS: 6.4 Rate Pressure Product:  78469    Stress Test Technologist:  Rea [employer] CMA-N     Nuclear Technologist:  Burna Mortimer Deal RT-N  Rest Procedure  Myocardial perfusion imaging was performed at rest 45 minutes following the intravenous administration of Myoview Technetium 30m Tetrofosmin.  Stress Procedure  The patient exercised for 4:30.  The patient stopped due to a hypertensive response, 212/77 (no medications were taken today).  She denied any chest pain.  There were no significant ST-T wave changes, only occasional PVC's in recovery.  Myoview was injected at peak exercise and myocardial perfusion imaging was performed after a brief delay.  QPS Raw Data Images:  Normal; no motion artifact; normal heart/lung ratio. Stress Images:  Small apical perfusion defect. Rest Images:   Small apical perfusion defect.  Subtraction (SDS):  Fixed apical defect.  Transient Ischemic Dilatation:  .93  (Normal <1.22)  Lung/Heart Ratio:  .31  (Normal <0.45)  Quantitative Gated Spect Images QGS EDV:  70 ml QGS ESV:  28 ml QGS EF:  60 % QGS cine images:  Normal wall motion.    Overall Impression  Exercise Capacity: Poor exercise capacity. BP Response: Hypertensive blood pressure response. Clinical Symptoms: Shortness of breath, no chest pain.  ECG Impression: Insignificant upsloping ST segment depression.  PVCs in recovery.  Overall Impression: Fixed apical defect likely represents apical thinning.  No evidence for ischemia or infarction.  Normal systolic function.  Poor exercise capacity.   Appended Document: Cardiology Nuclear Study ok  Appended Document: Cardiology Nuclear Study Left message to call back    Appended Document: Cardiology Nuclear Study pt. aware.

## 2010-12-19 NOTE — Assessment & Plan Note (Signed)
Summary: NP6/HYPERTENSION   Primary Provider:  Silvestre Gunner MD  CC:  referal from Dr Aundria Rud and pt states when she exercises she gets a little chest pressure.  History of Present Illness: Pleasant 55 year old who I am asked to evaluate for chest pain. The patient has no prior cardiac history. The patient states that for the last few months she has had occasional chest pain. It is substernal without radiation. It is described as a squeezing sensation. No associated symptoms. It is not pleuritic or positional. She notices it when she exercises. If she continues it resolves. She does not typically have these symptoms at rest. She otherwise denies dyspnea on exertion, orthopnea, PND, pedal edema or syncope. Because of the above we were asked to further evaluate.  Current Medications (verified): 1)  Lisinopril 40 Mg Tabs (Lisinopril) .... Take 1 Tablet By Mouth Once A Day 2)  Glucotrol Xl 10 Mg Tb24 (Glipizide) .... Take 1 Tablet By Mouth Two Times A Day 3)  Glucophage 1000 Mg Tabs (Metformin Hcl) .... Take 1 Tablet By Mouth Two Times A Day 4)  Norvasc 5 Mg Tabs (Amlodipine Besylate) .... Take 1 Tablet By Mouth Once A Day 5)  Zocor 40 Mg Tabs (Simvastatin) .... Take 1 Tablet By Mouth At Bedtime 6)  Accu-Chek Instant Plus Test  Strp (Glucose Blood) .... As Needed 7)  Ferrous Sulfate 324 Mg Tbec (Ferrous Sulfate) .... Take 1 Tablet By Mouth Three Times A Day 8)  Ultram 50 Mg Tabs (Tramadol Hcl) .... Once Daily 9)  Vytorin 10-40 Mg Tabs (Ezetimibe-Simvastatin) .... Once Daily  Allergies: No Known Drug Allergies  Past History:  Past Medical History: HYPERTENSION (ICD-401.9) HYPERLIPIDEMIA (ICD-272.4) ALLERGIC RHINITIS (ICD-477.9) ANEMIA-IRON DEFICIENCY (ICD-280.9) MENORRHAGIA (ICD-626.2) DIABETES MELLITUS, TYPE II (ICD-250.00)  Past Surgical History:  hysterectomy  Cystoscopy.   Family History: Reviewed history from 05/19/2007 and no changes required. No significant family history.  No histrory of problem with anesthetic agents in the family.  No premature CAD  Social History: Reviewed history from 05/19/2007 and no changes required. Lives with husband. 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  Never Smoked Alcohol use-occasional Drug use-no  Review of Systems       no fevers or chills, productive cough, hemoptysis, dysphasia, odynophagia, melena, hematochezia, dysuria, hematuria, rash, seizure activity, orthopnea, PND, pedal edema, claudication. Remaining systems are negative.   Vital Signs:  Patient profile:   55 year old female Height:      60 inches Weight:      173 pounds BMI:     33.91 Pulse rate:   64 / minute Resp:     12 per minute BP sitting:   162 / 72  (left arm)  Vitals Entered By: Kem Parkinson (November 23, 2009 11:45 AM)  Physical Exam  General:  Well developed/well nourished in NAD Skin warm/dry Patient not depressed No peripheral clubbing Back-normal HEENT-normal/normal eyelids Neck supple/normal carotid upstroke bilaterally; no bruits; no JVD; no thyromegaly chest - CTA/ normal expansion CV - RRR/normal S1 and S2; no murmurs, rubs or gallops;  PMI nondisplaced Abdomen -NT/ND, no HSM, no mass, + bowel sounds, no bruit 2+ femoral pulses, no bruits Ext-no edema, chords, 2+ DP Neuro-grossly nonfocal     EKG  Procedure date:  10/23/2009  Findings:      Sinus bradycardia at a rate of 51. No ST changes.  Impression & Recommendations:  Problem # 1:  CHEST PAIN, EXERTIONAL (ICD-786.50) The patient's symptoms are worrisome. She has multiple  risk factors including 12 years of diabetes mellitus. I recommended cardiac catheterization. I think definitive evaluation is warranted. We had long discussions in the office concerning this. However she did not wish to proceed with a cardiac catheterization. I explained the risks of undiagnosed coronary disease including myocardial infarction. She would prefer to begin with a functional  study. We will schedule a Myoview. I've asked her to begin aspirin. I will see her back in 4-6 weeks and we will proceed with the catheterization if her functional study is abnormal. Her updated medication list for this problem includes:    Lisinopril 40 Mg Tabs (Lisinopril) .Marland Kitchen... Take 1 tablet by mouth once a day    Norvasc 5 Mg Tabs (Amlodipine besylate) .Marland Kitchen... Take 1 tablet by mouth once a day    Aspirin 81 Mg Tbec (Aspirin) .Marland Kitchen... Take one tablet by mouth daily  Orders: Nuclear Stress Test (Nuc Stress Test)  Problem # 2:  HYPERTENSION (ICD-401.9) Her blood pressure is elevated today. I will increase her amlodipine to 10 mg p.o. daily both as an anti-anginal and also for blood pressure. Her updated medication list for this problem includes:    Lisinopril 40 Mg Tabs (Lisinopril) .Marland Kitchen... Take 1 tablet by mouth once a day    Amlodipine Besylate 10 Mg Tabs (Amlodipine besylate) .Marland Kitchen... Take one tablet by mouth daily    Aspirin 81 Mg Tbec (Aspirin) .Marland Kitchen... Take one tablet by mouth daily  Problem # 3:  HYPERLIPIDEMIA (ICD-272.4) Continue Vytorin. She is not taking the Zocor. Lipids and liver monitored by her primary care. Her updated medication list for this problem includes:    Zocor 40 Mg Tabs (Simvastatin) .Marland Kitchen... Take 1 tablet by mouth at bedtime    Vytorin 10-40 Mg Tabs (Ezetimibe-simvastatin) ..... Once daily  Problem # 4:  DIABETES MELLITUS, TYPE II (ICD-250.00)  Her updated medication list for this problem includes:    Lisinopril 40 Mg Tabs (Lisinopril) .Marland Kitchen... Take 1 tablet by mouth once a day    Glucotrol Xl 10 Mg Tb24 (Glipizide) .Marland Kitchen... Take 1 tablet by mouth two times a day    Glucophage 1000 Mg Tabs (Metformin hcl) .Marland Kitchen... Take 1 tablet by mouth two times a day    Aspirin 81 Mg Tbec (Aspirin) .Marland Kitchen... Take one tablet by mouth daily  Patient Instructions: 1)  Your physician recommends that you schedule a follow-up appointment in: 4-6 WEEKS 2)  Your physician has recommended you make the  following change in your medication: START ASPIRIN 81MG  ONE TABLET ONCE DAILY 3)  INCREASE AMLODIPINE TO 10MG  ONCE DAILY 4)  Your physician has requested that you have an exercise stress myoview.  For further information please visit [email or web address]  Please follow instruction sheet, as given. Prescriptions: AMLODIPINE BESYLATE 10 MG TABS (AMLODIPINE BESYLATE) Take one tablet by mouth daily  #30 x 12   Entered by:   Deliah Goody, RN   Authorized by:   Ferman Hamming, MD, [facility]   Signed by:   Deliah Goody, RN on 11/23/2009   Method used:   Electronically to        [facility]* (retail)       [address]       [address] Shipping/mailing       [place], [place]  [address]       Ph: [phone]       Fax: [phone]   RxID:   [id number]

## 2011-01-23 ENCOUNTER — Other Ambulatory Visit: Payer: Self-pay | Admitting: Internal Medicine

## 2011-01-23 DIAGNOSIS — Z1231 Encounter for screening mammogram for malignant neoplasm of breast: Secondary | ICD-10-CM

## 2011-02-03 ENCOUNTER — Ambulatory Visit (HOSPITAL_COMMUNITY)
Admission: RE | Admit: 2011-02-03 | Discharge: 2011-02-03 | Disposition: A | Discharge status: Home / Self Care | Payer: 59 | Source: Ambulatory Visit | Attending: Internal Medicine | Admitting: Internal Medicine

## 2011-02-03 DIAGNOSIS — Z1231 Encounter for screening mammogram for malignant neoplasm of breast: Secondary | ICD-10-CM | POA: Insufficient documentation

## 2011-02-04 LAB — GLUCOSE, CAPILLARY: Glucose-Capillary: 162 mg/dL — ABNORMAL HIGH (ref 70–99)

## 2011-02-25 ENCOUNTER — Encounter: Payer: Self-pay | Admitting: Internal Medicine

## 2011-02-25 ENCOUNTER — Ambulatory Visit (INDEPENDENT_AMBULATORY_CARE_PROVIDER_SITE_OTHER): Payer: Commercial Managed Care - PPO | Admitting: Internal Medicine

## 2011-02-25 DIAGNOSIS — I10 Essential (primary) hypertension: Secondary | ICD-10-CM

## 2011-02-25 DIAGNOSIS — E119 Type 2 diabetes mellitus without complications: Secondary | ICD-10-CM

## 2011-02-25 DIAGNOSIS — D509 Iron deficiency anemia, unspecified: Secondary | ICD-10-CM

## 2011-02-25 DIAGNOSIS — E785 Hyperlipidemia, unspecified: Secondary | ICD-10-CM

## 2011-02-25 LAB — COMPREHENSIVE METABOLIC PANEL
AST: 19 U/L (ref 0–37)
Albumin: 4.3 g/dL (ref 3.5–5.2)
BUN: 10 mg/dL (ref 6–23)
CO2: 20 mEq/L (ref 19–32)
Calcium: 9.6 mg/dL (ref 8.4–10.5)
Creat: 0.64 mg/dL (ref 0.40–1.20)
Glucose, Bld: 161 mg/dL — ABNORMAL HIGH (ref 70–99)
Sodium: 141 mEq/L (ref 135–145)

## 2011-02-25 LAB — LIPID PANEL
Cholesterol: 143 mg/dL (ref 0–200)
HDL: 45 mg/dL (ref >39)
Total CHOL/HDL Ratio: 3.2 Ratio
Triglycerides: 92 mg/dL (ref <150)

## 2011-02-25 LAB — TSH: TSH: 0.909 u[IU]/mL (ref 0.350–4.500)

## 2011-02-25 MED ORDER — LISINOPRIL-HYDROCHLOROTHIAZIDE 20-12.5 MG PO TABS: 2.0000 | ORAL_TABLET | Freq: Every day | ORAL | Status: DC

## 2011-02-25 MED ORDER — ROSUVASTATIN CALCIUM 20 MG PO TABS: 20.0000 mg | ORAL_TABLET | Freq: Every day | ORAL | Status: DC

## 2011-02-25 MED ORDER — GLIPIZIDE 10 MG PO TABS: 10.0000 mg | ORAL_TABLET | Freq: Two times a day (BID) | ORAL | Status: DC

## 2011-02-25 MED ORDER — AMLODIPINE BESYLATE 10 MG PO TABS: 10.0000 mg | ORAL_TABLET | Freq: Every evening | ORAL | Status: DC

## 2011-02-25 MED ORDER — METFORMIN HCL 1000 MG PO TABS: 1000.0000 mg | ORAL_TABLET | Freq: Two times a day (BID) | ORAL | Status: DC

## 2011-02-25 NOTE — Patient Instructions (Signed)
Diabetes and Exercise Regular exercise is important and can help:   Control blood glucose (sugar).   Decrease blood pressure.   Control blood lipids (cholesterol and triglycerides).   Improve overall health.  BENEFITS FROM EXERCISE:  Improved fitness.   Improved flexibility.   Improved endurance.   Increased bone density.   Weight control.   Increased muscle strength.   Decreased body fat.   Improvement of the body's use of a hormone called insulin.   Increased insulin sensitivity.   Reduction of insulin needs.   Helps you feel better.   Reduces stress and tension.  People with diabetes who add exercise to their lifestyle gain additional benefits.   Weight loss.   Reduces appetite.   Improves body's use of blood glucose (sugar).   Decreases risk factors for heart disease:   Lowering of cholesterol and triglycerides.   Raising the level of good cholesterol (high-density lipoproteins [HDL]).   Lowering blood sugar.   Decreases blood pressure.  TYPE 1 DIABETES AND EXERCISE  Exercise will usually lower your blood glucose.   If blood glucose is greater than 240 mg/dl, check urine ketones. If ketones are present, do not exercise.   Location of the insulin injection sites may need to be adjusted with exercise. Avoid injecting insulin into areas of the body that will be exercised. For example, avoid injecting insulin into:   The arms when playing tennis.   The legs when jogging. For more information, discuss this with your caregiver.   Keep a record of:   Food intake.   Type and amount of exercise.   Expected peak times of insulin action.   Blood glucose (sugar) levels.  Do this before, during and after exercise. Review your records with your caregiver(s). This will help you to develop guidelines for adjusting food intake and/or insulin amounts.  TYPE 2 DIABETES AND EXERCISE  Regular physical activity can help control blood glucose.   Exercise is  important because it may:   Increase the body's sensitivity to insulin.   Improve blood glucose control.   Exercise reduces the risk of heart disease. It decreases serum cholesterol and triglycerides. It also lowers blood pressure.   Those who take insulin or oral hypoglycemic agents should watch for signs of hypoglycemia. These signs include dizziness, shaking, sweating, chills and confusion.   Body water is lost during exercise. It must be replaced. This will help to avoid loss of body fluids (dehydration) and/or heat stroke.  Be sure to talk to your caregiver before starting an exercise program to make sure it is safe for you. Remember, any activity is better than none.  Document Released: 01/24/2004 Document Re-Released: 08/31/2009 [facility] Patient Information 2011 [place], [place].     Calorie Counting Diet A calorie counting diet requires you to eat the number of calories that are right for you during a day. Calories are the measurement of how much energy you get from the food you eat. Eating the right amount of calories is important for staying at a healthy weight. If you eat too many calories your body will store them as fat and you may gain weight. If you eat too few calories you may lose weight. Counting the number of calories that you eat during a day will help you to know if you're eating the right amount. A Registered Dietitian can determine how many calories you need in a day. The amount of calories you need varies from person to person. If your goal is to  lose weight you will need to eat fewer calories. Losing weight can benefit you if you are overweight or have health problems such as heart disease, high blood pressure or diabetes. If your goal is to gain weight, you will need to eat more calories. Gaining weight may be necessary if you have a certain health problem that causes your body to need more energy. TIPS Whether you are increasing or decreasing the number of calories you  eat during a day, it may be hard to get used to changing what you eat and drink. The following are tips to help you keep track of the number of calories you are eating.  Measuring foods at home with measuring cups will help you to know the actual amount of food and number of calories you are eating.   Restaurants serve food in all different portion sizes. It is common that restaurants will serve food in amounts worth 2 or more serving sizes. While eating out, it may be helpful to estimate how many servings of a food you are given. For example, a serving of cooked rice is 1/2 cup and that is the size of half of a fist. Knowing serving sizes will help you have a better idea of how much food you are eating at restaurants.   Ask for smaller portion sizes or child-size portions at restaurants.   Plan to eat half of a meal at a restaurant and take the rest home or share the other half with a friend   Read food labels for calorie content and serving size   Most packaged food has a Nutrition Facts Panel on its side or back. Here you can find out how many servings are in a package, the size of a serving, and the number of calories each serving has.   The serving size and number of servings per container are listed right below the Nutrition Facts heading. Just below the serving information, the number of calories in each serving is listed.   For example, say that a package has three cookies inside. The Nutrition Facts panel says that one serving is one cookie. Below that, it says that there are three servings in the container. The calories section of the Nutrition Facts says there are 90 calories. That means that there are 90 calories in one cookie. If you eat one cookie you have eaten 90 calories. If you eat all three cookies, you have eaten three times that amount, or 270 calories.  The list below tells you how big or small some common portion sizes are.  1 ounce (oz).................4 stacked dice.   3  oz.............................Marland KitchenDeck of cards.   1 teaspoon (tsp)..........Marland KitchenTip of little finger.   1 tablespoon (Tbsp).Marland KitchenMarland KitchenMarland KitchenTip of thumb.   2 Tbsp.........................Marland KitchenGolf ball.    Cup.........................Marland KitchenHalf of a fist.   1 Cup..........................Marland KitchenA fist.  KEEP A FOOD LOG Write down every food item that you eat, how much of the food you eat, and the number of calories in each food that you eat during the day. At the end of the day or throughout the day you can add up the total number of calories you have eaten.  It may help to set up a list like the one below. Find out the calorie information by reading food labels.  Breakfast   Bran Flakes (1 cup, 110 calories).   Fat free milk ( cup, 45 calories).   Snack   Apple (1 medium, 80 calories).   Lunch   Spinach (1 cup,  20 calories).   Tomato ( medium, 20 calories).   Chicken breast strips (3 oz, 165 calories).   Shredded cheddar cheese ( cup, 110 calories).   Light [place] dressing (2 Tbsp, 60 calories).   Whole wheat bread (1 slice, 80 calories).   Tub margarine (1 tsp, 35 calories).   Vegetable soup (1 cup, 160 calories).   Dinner   Pork chop (3 oz, 190 calories).   Brown rice (1 cup, 215 calories).   Steamed broccoli ( cup, 20 calories).   Strawberries (1  cup, 65 calories).   Whipped cream (1 Tbsp, 50 calories).  Daily Calorie Total: 1425 Information from www.eatright.org, Foodwise Nutritional Analysis Database. Document Released: 11/03/2005 Document Re-Released: 11/25/2009 [facility] Patient Information 2011 [place], [place].    Set up an appointment in 1-3 months and bring your meter the next time on your visit.

## 2011-02-25 NOTE — Assessment & Plan Note (Signed)
We'll check lipid profile today.

## 2011-02-25 NOTE — Assessment & Plan Note (Signed)
We'll combine HCTZ and lisinopril together and would continue all the medications at this time. Follow up visit in June.

## 2011-02-25 NOTE — Assessment & Plan Note (Addendum)
Patient's HbA1c being 9.6 despite being on maximal metformin and Glucotrol doses is concerning. Patient is not willing to start insulin at this point of time. Given that patient is in short some of the other medications like Januvia and other GLP41mimetics can be used. Patient was not willing to start any new medications at this time. I would consider starting the patient on Janumet at next office visit if she is still uncontrolled. He was also advised to lose weight and be compliant with her diabetic diet. Patient is unwilling to see Jamison Neighbor as she says that it did not help her in the past.

## 2011-02-25 NOTE — Assessment & Plan Note (Signed)
Will check CBC today.  

## 2011-02-25 NOTE — Progress Notes (Signed)
  Subjective:    Patient ID: Emma Davenport, female    DOB: 13-Sep-1956, 55 y.o.   MRN: [id number]  HPI patient is a 55 year old female with type 2 diabetes mellitus for at least last 14 years. Patient has come in today for a regular checkup. Patient is an employee of [employer].  Chest percussion is slightly elevated today at 145/70. Patient needs refill for her blood pressure medications. I would try and combine her blood pressure medications to minimize noncompliance.  Agents HbA1c is 9.6 today. Patient mentions that she hasn't really been compliant with diabetic diet though she takes her medications on time. Patient has also gained about 5 pounds since her last office visit. Given that patient was 7.4 and the beginning of last year and her unwillingness to start insulin I would follow her back in June. Patient was advised to be compliant with her diet and medications and also try to lose at least 10 pounds.  Patient is going for a vacation in a [place] to her daughter's house. She mentions that she is currently taking care of the new baby in hopes that that would keep her more active and help her lose weight.  Patient is obese and has gained about 5 pounds his last office visit.   Patient is due for colonoscopy, Pap smear and eye exam.   Review of Systems  Constitutional: Negative for fever, activity change and appetite change.  HENT: Negative for sore throat.   Respiratory: Negative for cough and shortness of breath.   Cardiovascular: Negative for chest pain and leg swelling.  Gastrointestinal: Negative for nausea, abdominal pain, diarrhea, constipation and abdominal distention.  Genitourinary: Negative for frequency, hematuria and difficulty urinating.  Neurological: Negative for dizziness and headaches.  Psychiatric/Behavioral: Negative for suicidal ideas and behavioral problems.       Objective:   Physical Exam  Constitutional: She is oriented to person, place, and time. She  appears well-developed and well-nourished.  HENT:  Head: Normocephalic and atraumatic.  Eyes: Conjunctivae and EOM are normal. Pupils are equal, round, and reactive to light. No scleral icterus.  Neck: Normal range of motion. Neck supple. No JVD present. No thyromegaly present.  Cardiovascular: Normal rate, regular rhythm, normal heart sounds and intact distal pulses.  Exam reveals no gallop and no friction rub.   No murmur heard. Pulmonary/Chest: Effort normal and breath sounds normal. No respiratory distress. She has no wheezes. She has no rales.  Abdominal: Soft. Bowel sounds are normal. She exhibits no distension and no mass. There is no tenderness. There is no rebound and no guarding.  Musculoskeletal: Normal range of motion. She exhibits no edema and no tenderness.  Lymphadenopathy:    She has no cervical adenopathy.  Neurological: She is alert and oriented to person, place, and time.  Psychiatric: She has a normal mood and affect. Her behavior is normal.          Assessment & Plan:

## 2011-02-26 LAB — CBC WITH DIFFERENTIAL/PLATELET
Basophils Relative: 0 % (ref 0–1)
Hemoglobin: 11.8 g/dL — ABNORMAL LOW (ref 12.0–15.0)
Lymphocytes Relative: 40 % (ref 12–46)
MCV: 74.3 fL — ABNORMAL LOW (ref 78.0–100.0)
Monocytes Absolute: 0.3 10*3/uL (ref 0.1–1.0)
Monocytes Relative: 4 % (ref 3–12)
Neutro Abs: 4.1 10*3/uL (ref 1.7–7.7)
Neutrophils Relative %: 54 % (ref 43–77)
RBC: 4.99 MIL/uL (ref 3.87–5.11)

## 2011-03-03 ENCOUNTER — Ambulatory Visit: Payer: Commercial Managed Care - PPO | Admitting: Internal Medicine

## 2011-04-04 NOTE — Op Note (Signed)
   Emma Davenport, Emma Davenport                          ACCOUNT NO.:  [id number]   MEDICAL RECORD NO.:  [id number]                   PATIENT TYPE:  AMB   LOCATION:  SDC                                  FACILITY:  [facility]   PHYSICIAN:  Osborn Coho, M.D.                DATE OF BIRTH:  Nov 14, 1956   DATE OF PROCEDURE:  09/01/2002  DATE OF DISCHARGE:                                 OPERATIVE REPORT   ADDENDUM:   PROCEDURE:  1. Dilatation and curettage.  2. Hysteroscopy.  3. Resection of fibroid.   PROCEDURE:  After the tenaculum was placed on the anterior lip of the  cervix, the cervix was dilated for passage of the hysteroscope and  resectoscope accordingly.                                               Osborn Coho, M.D.    AR/MEDQ  D:  09/01/2002  T:  09/02/2002  Job:  [id number]

## 2011-04-04 NOTE — H&P (Signed)
Emma Davenport, Emma Davenport NO.:  [id number]   MEDICAL RECORD NO.:  [id number]          PATIENT TYPE:  AMB   LOCATION:  SDC                           FACILITY:  [facility]   PHYSICIAN:  Osborn Coho, M.D.   DATE OF BIRTH:  1956-06-27   DATE OF ADMISSION:  DATE OF DISCHARGE:                              HISTORY & PHYSICAL   CHIEF COMPLAINT:  Symptomatic fibroids with dyspareunia.   HISTORY OF PRESENT ILLNESS:  Emma Davenport is a 55 year old gravida 4, para  3-0-1-2, with a last menstrual period of October 2007, with symptomatic  fibroids, complaining of discomfort and dyspareunia that is affecting  lifestyle.  The patient presented back in January complaining of  menorrhagia and dysmenorrhea as well and desired definitive management  with a hysterectomy.  Since that time, the patient's last menstrual  cycle was in October and I discussed observation secondary to no longer  with menorrhagia; however, the patient continued to want hysterectomy,  as she says that the discomfort is affecting her lifestyle and primarily  the dyspareunia, which she believes is a result of her fibroids.  The  patient has been instructed that there is no guarantee that at this  point it is a result of her fibroids and she verbalized understanding  and wanted to proceed.   PAST OBSTETRICAL HISTORY:  1. Normal spontaneous vaginal delivery x3, full-term, largest about 7      pounds.  2. Spontaneous abortion x1.  3. One child died at 51 years old.   PAST GYNECOLOGIC HISTORY:  History of regular menses that were heavy and  painful for several years and continues to have pelvic discomfort and  dyspareunia.   PAST MEDICAL HISTORY:  1. High blood pressure.  2. Diabetes.   PAST SURGICAL HISTORY:  Denies.   MEDICINES:  1. Metformin 1000 mg b.i.d.  2. Glipizide 40 mg daily.  3. Lisinopril/hydrochlorothiazide.   ALLERGIES:  No known drug allergies.   SOCIAL HISTORY:  Denies cigarette use, alcohol use  or drug use and lives  with her husband.   REVIEW OF SYSTEMS:  Denies shortness of breath or chest pain with  activity.  Denies fever, chills, nausea, GU or GI problems.  Reports  occasional vomiting and dizziness.   PHYSICAL EXAMINATION:  HEENT:  Within normal limits.  NECK:  Thyroid not enlarged.  HEART:  Rate and rhythm are regular.  CHEST:  Clear to auscultation bilaterally.  BREASTS:  On previous breast exam, no masses, discharge, skin changes,  or nipple retraction.  BACK:  No CVA tenderness.  ABDOMEN:  Soft and nontender.  EXTREMITIES:  Within normal limits.  PELVIC:  External genitalia within normal limits.  Uterus approximately  10-12 weeks' size, nontender and no palpable adnexal masses.   LABORATORY AND ACCESSORY CLINICAL DATA:  Endometrial biopsy, November 24, 2006:  No hyperplasia or carcinoma identified.   Ultrasound, January 8:  Endometrial thickness measuring 2.2 cm, uterus  measuring 13.1 x 8.5 x 9.6 cm with multiple fibroids.  There is a left  ovarian simple cyst and right ovary difficult to identify.   ASSESSMENT AND  PLAN:  Emma Davenport is a 55 year old gravida 4, para 2 with  symptomatic fibroids, primarily pelvic discomfort and dyspareunia and  desires hysterectomy and removal of ovaries and tubes.  The risks,  benefits and alternatives have been discussed at length with Emma Davenport  including, but not limited to, bleeding, infection, injury and  persistent pelvic discomfort and dyspareunia.  The patient was offered  observation at this point and declined and wanted to proceed with  hysterectomy.  The patient's questions were answered; the patient  verbalized understanding and office consent signed and witnessed,  hospital consent to be signed and witnessed as well.      Osborn Coho, M.D.  Electronically Signed     AR/MEDQ  D:  05/20/2007  T:  05/20/2007  Job:  [id number]

## 2011-04-04 NOTE — H&P (Signed)
   Emma Davenport, Emma Davenport                          ACCOUNT NO.:  [id number]   MEDICAL RECORD NO.:  [id number]                   PATIENT TYPE:  AMB   LOCATION:  SDC                                  [facility]ILITY:  [facility]   PHYSICIAN:  Osborn Coho, M.D.                DATE OF BIRTH:  [birth year]   DATE OF ADMISSION:  09/01/2002  DATE OF DISCHARGE:                                HISTORY & PHYSICAL   ADDENDUM  Please add medical record number to the earlier dictation.                                               Osborn Coho, M.D.    AR/MEDQ  D:  09/01/2002  T:  09/01/2002  Job:  [id number]

## 2011-04-04 NOTE — Op Note (Signed)
NAMEJERALYNN, VAQUERA NO.:  [id number]   MEDICAL RECORD NO.:  [id number]          PATIENT TYPE:  INP   LOCATION:  9302                          [facility]ACILITY:  [facility]   PHYSICIAN:  Osborn Coho, M.D.   DATE OF BIRTH:  May 24, 1956   DATE OF PROCEDURE:  05/25/2007  DATE OF DISCHARGE:  05/21/2007                               OPERATIVE REPORT   ADDENDUM:   PROCEDURES:  Cystoscopy.   After repairing the incisions which had been injected with Marcaine at  the beginning of the case.  Cystoscopy was performed without difficulty.  The bilateral ureters were noted to efflux without difficulty and Foley  was left to gravity.  A bivalve speculum was also placed in the  patient's vagina and there was good hemostasis at the vaginal cuff.  The  vagina was packed with 2-inch plain estrogen soaked packing and again  sponge, lap and needle count was correct at the end of the case and the  patient was returned to the recovery room in good condition.      Osborn Coho, M.D.  Electronically Signed     AR/MEDQ  D:  05/26/2007  T:  05/26/2007  Job:  [id number]

## 2011-04-04 NOTE — H&P (Signed)
   NAME:  Emma Davenport, GEOFFROY NO.:  [id number]   MEDICAL RECORD NO.:  [id number]                   PATIENT TYPE:   LOCATION:                                       FACILITY:   PHYSICIAN:  Osborn Coho, M.D.                DATE OF BIRTH:   DATE OF ADMISSION:  08/31/2002  DATE OF DISCHARGE:                                HISTORY & PHYSICAL   CHIEF COMPLAINT:  Very heavy menses and bleeding between her menses.   HISTORY OF PRESENT ILLNESS:  The patient is a 55 year old gravida 4, para 3-  0-1-2 who reports very heavy menses and bleeding between her cycles.  This  had been going on for several months per the patient.  She takes two tablets  of iron daily.  She reports this recently started and this is the first time  she has ever had any abnormal bleeding like this.  The patient reported a  history of a questionable cervical laceration at the time of one of her  prior deliveries and an office endometrial biopsy was attempted on July 26, 2002 but unsuccessful in the office.   PAST OBSTETRICAL HISTORY:  Three normal spontaneous vaginal deliveries (one  died at 55 years of age, elective termination of pregnancy x1).   PAST GYNECOLOGIC HISTORY:  History of regular menses until the past several  months, denies history of fibroids or abnormal Pap smears, denies history of  STDs, PID, gonorrhea, or Chlamydia.   PAST MEDICAL HISTORY:  High blood pressure, type 2 diabetes, and elevated  cholesterol.   PAST SURGICAL HISTORY:  Denies.   MEDICATIONS:  Lotensin, Glucophage, iron, Lipitor.   ALLERGIES:  No known drug allergies.   SOCIAL HISTORY:  The patient denies smoking or illicit drugs and reports  occasional alcohol use.   FAMILY HISTORY:  High blood pressure and diabetes.   PHYSICAL EXAMINATION:  Unchanged as per office exam.   ASSESSMENT AND PLAN:  The patient is a 55 year old gravida 4, para 2 with  menometrorrhagia.  An office endometrial biopsy  was attempted on July 26, 2002.  The office endometrial biopsy was unable to be performed secondary  to a questionable history of a cervical laceration with difficulty  penetrating cervical os.  The patient was very uncomfortable and opted for a  dilation and curettage and hysteroscopy in the operating room for tissue  sample diagnosis and evaluation of endometrial cavity.                                               Osborn Coho, M.D.    AR/MEDQ  D:  09/01/2002  T:  09/01/2002  Job:  [id number]

## 2011-04-04 NOTE — Op Note (Signed)
Emma Davenport, Emma Davenport                          ACCOUNT NO.:  [id number]   MEDICAL RECORD NO.:  [id number]                   PATIENT TYPE:  AMB   LOCATION:  SDC                                  [facility]   PHYSICIAN:  Osborn Coho, M.D.                DATE OF BIRTH:  16-May-1956   DATE OF PROCEDURE:  09/01/2002  DATE OF DISCHARGE:                                 OPERATIVE REPORT   PREOPERATIVE DIAGNOSES:  Menometrorrhagia.   POSTOPERATIVE DIAGNOSES:  1. Menometrorrhagia.  2. Fibroid.   PROCEDURE:  1. Dilatation and curettage.  2. Hysteroscopy.  3. Resection of fibroid.   SURGEON:  Osborn Coho, M.D.   ANESTHESIA:  General via endotracheal tube.   FLUIDS:  1600 cc.  Hysteroscopic fluid deficit 210 cc and approximately 25-  50 cc on floor.   URINE OUTPUT:  Straight catheterization prior to procedure (less than 100  cc).   ESTIMATED BLOOD LOSS:  Minimal, less than 50 cc.   FINDINGS:  Approximately 3-4 cm fibroid protruding into endometrial cavity  off left lateral and anterior aspect of uterus.   COMPLICATIONS:  None.   PATHOLOGY:  Specimens are resection of fibroid and endometrial curettings.   PROCEDURE:  The patient was taken to the operating room after risks,  benefits, and alternatives of procedure were discussed with the patient.  The patient verbalized understanding and consents signed and witnessed.  The  patient was placed under general anesthesia and placed in the dorsal  lithotomy position and prepped and draped in a normal sterile fashion.  A  speculum was placed in the patient's vagina and the anterior lip of the  cervix grasped with a single tooth tenaculum.  The uterus was sounded to  approximately 14 cm and noted to be anteverted with a sharp angle at the  cervical canal.  The diagnostic hysteroscope was then advanced into the  endometrial cavity and an approximately 3-4 cm fibroid was noted to be  protruding into endometrial cavity off the  left lateral anterior aspect of  the uterine wall.  A couple of pictures were taken at that time.  The  resectoscope was then used to resect a portion of the fibroid.  The base of  the fibroid was somewhat difficult to obtain but as much as could be  resected was done  intraoperatively.  A D&C was then performed.  Specimens were sent to  pathology.  There was minimal bleeding noted at the end of the procedure and  the tenaculum was removed with hemostasis at the tenaculum sites.  The  speculum was removed as well.  The patient was returned to the recovery room  in stable condition.  Osborn Coho, M.D.    AR/MEDQ  D:  09/01/2002  T:  09/01/2002  Job:  [id number]

## 2011-04-04 NOTE — Op Note (Signed)
Emma Davenport, Emma Davenport              ACCOUNT NO.:  [id number]   MEDICAL RECORD NO.:  [id number]          PATIENT TYPE:  INP   LOCATION:  9302                          [facility]ILITY:  [facility]   PHYSICIAN:  Osborn Coho, M.D.   DATE OF BIRTH:  Aug 19, 1956   DATE OF PROCEDURE:  05/25/2007  DATE OF DISCHARGE:  05/21/2007                               OPERATIVE REPORT   PREOPERATIVE DIAGNOSIS:  1. Pelvic discomfort.  2. Dyspareunia.  3. Symptomatic fibroids.   POSTOPERATIVE DIAGNOSIS:  1. Pelvic discomfort.  2. Dyspareunia.  3. Symptomatic fibroids.   PROCEDURE:  1. Total laparoscopic hysterectomy.  2. Bilateral salpingo-oophorectomy.  3. Cystoscopy.   ATTENDING:  Osborn Coho, M.D.   ASSISTANT:  Naima A. Normand Sloop, M.D.   ANESTHESIA:  General.   SPECIMENS TO PATHOLOGY:  Morcellated uterus, cervix, bilateral ovaries  and bilateral tubes weighing 500 grams.   FLUIDS:  4000 mL   URINE OUTPUT:  700 mL   ESTIMATED BLOOD LOSS:  300 mL   COMPLICATIONS:  None   PROCEDURE:  The patient was taken to the operating room after risks,  benefits, alternatives reviewed with the patient.  The patient  verbalized understanding and consent signed and witnessed.  The patient  was placed under general anesthesia and prepped and draped in the normal  sterile fashion.  A bivalve speculum was placed in the patient's vagina  and the cervix was noted to be void of a posterior lip, however it  appeared that the size 3.5 co-ring would be the most appropriate size.  The Rumi numeral occluder and co-ring was then placed in through the  patient's cervix and into the uterus after changing the bivalve speculum  to weighted speculum and Deaver.  The intrauterine balloon was then  inflated with 10 mL of normal saline and the normal occluder was  inflated with 100 mL.  Attention was then turned to the abdomen where a  10 mm incision was made at the umbilicus and the Veress needle  introduced into the  intra-abdominal cavity and pneumoperitoneum  achieved.  10 mm trocar was then advanced into the intra-abdominal  cavity without difficulty.  The laparoscope was introduced and 13-14  weeks size fibroid uterus noted with the anterior and posterior fibroid  in the lower uterine segment.  Attention was then turned to the right  lower quadrant where a 10 mm incision was made and trocar advanced into  the intra-abdominal cavity under direct visualization. This was done  slightly more distal than the second port which was 5 mm.  It was done  more distal from the anterior-superior iliac spine and slightly more  caudal.  The 5 mm incision was then made and trocar introduced under  direct visualization.  Attention was then turned to left lower quadrant  where a 5 mm incision was made and carried down and trocar introduced  under direct visualization.  The gyrus tripolar was then used and right  ureter identified and noted to peristalsis without difficulty.  The  infundibulopelvic on the right was then sequentially cauterized and cut  using the gyrus tripolar.  This  was done after the round ligament on the  right was cauterized and cut with the tripolar and bladder flap created  on the right.  The same was done on the contralateral side.  After  cauterizing and excising the right infundibulopelvic ligament.  The  right broad ligament was cauterized and cut down to the level of the  round ligament which had been transected.  The fallopian tube was also  included with the specimen that was going to be removed.  The left  ureter was noted to peristalsis and on the left side the left  infundibulopelvic ligament in a similar fashion was cauterized and  transected incorporating the fallopian tube and taking it down to the  transected round ligament as well.  While pushing on the Rumi to elevate  the co-ring as much as possible the anterior cul-de-sac was entered and  the fornix was attempted to be excised  and was excised to the lateral  areas where the uterine vessels were then cauterized and transected as  well.  All of this being done with the bipolar spatula.  While entering  the anterior cul-de-sac the anterior fibroid was being deflected with a  single-tooth tenaculum.  Posteriorly for the posterior cul-de-sac in a  similar fashion the spatula was used to excise the posterior fornix  while the posterior fibroid was being deflected with a single-tooth  tenaculum and bowel was being deflected away from that area as well.  Once the vaginal cuff was completely excised the uterus was attempted to  be removed vaginally via morcellation however, this was somewhat  difficult and decision was made to therefore morcellate the uterus  laparoscopically which was then done without difficulty and a small  portion of the uterus that was remaining was removed vaginally and left  to remain in the vagina to help keep minimal peritoneum.  The Rumi had  been removed prior to laparoscopic morcellation.  The vaginal cuff was  then repaired with at least five stitches of 0 PDS starting at the  angles.  There was good hemostasis noted and copious irrigation was  performed.  The trocars were removed under direct visualization and  pneumoperitoneum relieved.  The umbilical trocar was then removed under  direct visualization as well.  The left lower quadrant 5 mm port had  been expanded to a 10 mm port and all 10 mm ports were repaired at the  level of the fascia using 0 Vicryl and the skin was reapproximated using  3-0 Monocryl.  The 5 mm right lower quadrant port was repaired with  Dermabond.  Sponge, lap and needle count was correct.  The patient  tolerated procedure well and was returned to the recovery room in good  condition.      Osborn Coho, M.D.  Electronically Signed     AR/MEDQ  D:  05/26/2007  T:  05/26/2007  Job:  [id number]

## 2011-04-04 NOTE — Discharge Summary (Signed)
Emma Davenport, Emma Davenport              ACCOUNT NO.:  [id number]   MEDICAL RECORD NO.:  [id number]          PATIENT TYPE:  INP   LOCATION:  9302                          [facility]ACILITY:  [facility]   PHYSICIAN:  Osborn Coho, M.D.   DATE OF BIRTH:  1956/10/08   DATE OF ADMISSION:  05/20/2007  DATE OF DISCHARGE:  05/21/2007                               DISCHARGE SUMMARY   DISCHARGE DIAGNOSIS:  Symptomatic uterine fibroids, dyspareunia and  pelvic pain.   OPERATION:  On the day of admission the patient underwent a total  laparoscopic hysterectomy with bilateral salpingo-oophorectomy and  cystoscopy tolerating procedures well.   HISTORY OF PRESENT ILLNESS:  The patient is a 55 year old female para 3-  0-1-2 who presents for hysterectomy because of symptomatic uterine  fibroids, dyspareunia and pelvic pain.  Please see the patient's  dictated history and physical examination for details.   PREOPERATIVE PHYSICAL EXAM:  GENERAL:  Within normal limits.  PELVIC:  External genitalia was normal.  Uterus was approximately 10-12  weeks size, nontender without any palpable adnexal masses.   HOSPITAL COURSE:  On the day of admission the patient underwent  aforementioned procedures tolerating them all well.  Postoperative  course was unremarkable with the patient resuming bowel and bladder  function by postop day #1 and therefore deemed ready for discharge home.  Discharge hemoglobin 10.4 (preoperative hemoglobin 11.4).  The patient's  discharge basic metabolic panel was within normal limits with the  exception of her glucose being elevated however, the patient does have  diabetes.   DISCHARGE MEDICATIONS:  1. Motrin 600 mg every 6 hours with food.  2. Percocet one to two every 4 hours as needed for pain.  3. Ciprofloxacin 250 mg twice daily for 5 days.  4. Colace 100 mg one to two times daily until bowel movements are      regular.   FOLLOW UP:  The patient is to follow up with Dr. Su Hilt as scheduled  or  to call for an appointment in 2 weeks at [phone].   DISCHARGE INSTRUCTIONS:  The patient was given a copy of [facility] postoperative instruction sheet.  She was further  advised to call with fever greater than 100.4 or equal to 100.4, severe  pain, vomiting, bleeding.  She was advised to avoid driving for 1 week,  heavy lifting for 6 weeks (nothing greater than 10-15 pounds).  No  intercourse for 6 weeks.  She was advised that she may shower.  She may  walk up steps and should increase her activities slowly.  She was  advised to be a diabetic diet.   FINAL PATHOLOGY:  Uterus, cervix, ovaries and fallopian tubes total  hysterectomy morcellated:  Leiomyomata, benign endometrial polyp, benign  proliferative endometrium, unremarkable cervix, bilateral ovaries and  fallopian tubes.      Elmira J. Adline Peals.      Osborn Coho, M.D.  Electronically Signed    EJP/MEDQ  D:  07/12/2007  T:  07/12/2007  Job:  [id number]

## 2011-06-06 ENCOUNTER — Telehealth: Payer: Self-pay | Admitting: *Deleted

## 2011-06-06 NOTE — Telephone Encounter (Signed)
Call to pt to ask if she is ready to her Colonoscopy done.  Message have been left on 2 different occassions for pt to call the Clinics.

## 2011-06-07 ENCOUNTER — Encounter: Payer: Self-pay | Admitting: Internal Medicine

## 2011-06-12 ENCOUNTER — Ambulatory Visit
Admission: RE | Admit: 2011-06-12 | Discharge: 2011-06-12 | Disposition: A | Discharge status: Home / Self Care | Payer: No Typology Code available for payment source | Source: Ambulatory Visit | Attending: Infectious Diseases | Admitting: Infectious Diseases

## 2011-06-12 ENCOUNTER — Other Ambulatory Visit: Payer: Self-pay | Admitting: Infectious Diseases

## 2011-06-12 DIAGNOSIS — R7611 Nonspecific reaction to tuberculin skin test without active tuberculosis: Secondary | ICD-10-CM

## 2011-07-02 ENCOUNTER — Ambulatory Visit (AMBULATORY_SURGERY_CENTER): Payer: Self-pay | Admitting: *Deleted

## 2011-07-02 VITALS — Ht 65.0 in | Wt 172.0 lb

## 2011-07-02 DIAGNOSIS — Z1211 Encounter for screening for malignant neoplasm of colon: Secondary | ICD-10-CM

## 2011-07-02 MED ORDER — PEG-KCL-NACL-NASULF-NA ASC-C 100 G PO SOLR: ORAL | Status: DC

## 2011-07-17 ENCOUNTER — Other Ambulatory Visit: Payer: 59 | Admitting: Gastroenterology

## 2011-07-18 ENCOUNTER — Ambulatory Visit (AMBULATORY_SURGERY_CENTER): Payer: Self-pay | Admitting: Gastroenterology

## 2011-07-18 ENCOUNTER — Encounter: Payer: Self-pay | Admitting: Gastroenterology

## 2011-07-18 VITALS — BP 196/85 | HR 67 | Temp 98.2°F | Resp 16 | Ht 65.0 in | Wt 172.0 lb

## 2011-07-18 DIAGNOSIS — D126 Benign neoplasm of colon, unspecified: Secondary | ICD-10-CM

## 2011-07-18 DIAGNOSIS — Z1211 Encounter for screening for malignant neoplasm of colon: Secondary | ICD-10-CM

## 2011-07-18 LAB — GLUCOSE, CAPILLARY: Glucose-Capillary: 116 mg/dL — ABNORMAL HIGH (ref 70–99)

## 2011-07-18 MED ORDER — SODIUM CHLORIDE 0.9 % IV SOLN: 500.0000 mL | INTRAVENOUS | Status: DC

## 2011-07-18 NOTE — Patient Instructions (Signed)
Green and blue discharge instructions reviewed with patient and care partner.  Impressions/recommendations:  Polyps (handout given)   1 year followup colonoscopy with propofol sedation  Continue medications as you were taking them prior to your procedure. Hold all aspirin containing products, Advil and arthritis medications until September 15th 2012.

## 2011-07-22 ENCOUNTER — Telehealth: Payer: Self-pay | Admitting: *Deleted

## 2011-07-22 LAB — GLUCOSE, CAPILLARY: Glucose-Capillary: 119 mg/dL — ABNORMAL HIGH (ref 70–99)

## 2011-07-22 NOTE — Telephone Encounter (Signed)
Writer called pt's number, female voice answered.  He stated she wasn't at home and he hung up on Clinical research associate

## 2011-07-29 ENCOUNTER — Encounter: Payer: Self-pay | Admitting: Gastroenterology

## 2011-09-02 LAB — BASIC METABOLIC PANEL
BUN: 3 — ABNORMAL LOW
BUN: 7
CO2: 30
Calcium: 9.3
Creatinine, Ser: 0.53
GFR calc non Af Amer: >60
Glucose, Bld: 150 — ABNORMAL HIGH
Glucose, Bld: 161 — ABNORMAL HIGH
Potassium: 3.5
Sodium: 138

## 2011-09-02 LAB — CBC
HCT: 31.9 — ABNORMAL LOW
Hemoglobin: 11.4 — ABNORMAL LOW
MCHC: 32.3
MCV: 75.8 — ABNORMAL LOW
Platelets: 193
RBC: 4.69
RDW: 16.4 — ABNORMAL HIGH
WBC: 5.6

## 2011-11-03 ENCOUNTER — Encounter: Payer: 59 | Attending: Internal Medicine | Admitting: *Deleted

## 2011-11-05 ENCOUNTER — Encounter: Payer: Self-pay | Admitting: *Deleted

## 2011-11-05 NOTE — Progress Notes (Addendum)
  Patient was seen on 11/03/11 for the first of a series of three diabetes self-management courses at the [facility]. The following learning objectives were met by the patient during this course:   Defines the role of glucose and insulin  Identifies type of diabetes and pathophysiology  Defines the diagnostic criteria for diabetes and prediabetes  States the risk factors for Type 2 Diabetes  States the symptoms of Type 2 Diabetes  Defines Type 2 Diabetes treatment goals  Defines Type 2 Diabetes treatment options  States the rationale for glucose monitoring  Identifies A1C, glucose targets, and testing times  Identifies proper sharps disposal  Defines the purpose of a diabetes food plan  Identifies carbohydrate food groups  Defines effects of carbohydrate foods on glucose levels  Identifies carbohydrate choices/grams/food labels  States benefits of physical activity and effect on glucose  Review of suggested activity guidelines  Lab Results  Component Value Date   HGBA1C 9.6 02/25/2011   Handouts given during class include:  Type 2 Diabetes: Basics Book  My Food Plan Book  Food and Activity Log  [employer] Quick and Easy Diabetic Cooking (cookbook)  Patient has established the following initial goals:  Pt did not establish any goals at visit.  Follow-Up Plan: Pt to attend Core Diabetes Education Classes II and III in January 2013.

## 2011-11-05 NOTE — Patient Instructions (Signed)
Patient will attend Core Diabetes Education Courses II and III as scheduled or follow up prn.

## 2011-11-27 ENCOUNTER — Ambulatory Visit: Payer: 59

## 2011-12-02 ENCOUNTER — Ambulatory Visit: Payer: 59

## 2011-12-18 ENCOUNTER — Ambulatory Visit (INDEPENDENT_AMBULATORY_CARE_PROVIDER_SITE_OTHER): Payer: 59 | Admitting: Ophthalmology

## 2011-12-18 DIAGNOSIS — E11359 Type 2 diabetes mellitus with proliferative diabetic retinopathy without macular edema: Secondary | ICD-10-CM

## 2011-12-18 DIAGNOSIS — E11319 Type 2 diabetes mellitus with unspecified diabetic retinopathy without macular edema: Secondary | ICD-10-CM

## 2011-12-18 DIAGNOSIS — I10 Essential (primary) hypertension: Secondary | ICD-10-CM

## 2011-12-18 DIAGNOSIS — H35039 Hypertensive retinopathy, unspecified eye: Secondary | ICD-10-CM

## 2011-12-18 DIAGNOSIS — E1139 Type 2 diabetes mellitus with other diabetic ophthalmic complication: Secondary | ICD-10-CM

## 2011-12-18 DIAGNOSIS — H43819 Vitreous degeneration, unspecified eye: Secondary | ICD-10-CM

## 2011-12-30 ENCOUNTER — Encounter: Payer: 59 | Attending: Internal Medicine | Admitting: Dietician

## 2011-12-31 NOTE — Progress Notes (Signed)
  Patient was seen on 12/30/2011 for the second of a series of three diabetes self-management courses at the [facility]. The following learning objectives were met by the patient during this course:   Explain basic nutrition maintenance and quality assurance  Describe causes, symptoms and treatment of hypoglycemia and hyperglycemia  Explain how to manage diabetes during illness  Describe the importance of good nutrition for health and healthy eating strategies  List strategies to follow meal plan when dining out  Describe the effects of alcohol on glucose and how to use it safely  Describe problem solving skills for day-to-day glucose challenges  Describe strategies to use when treatment plan needs to change  Identify important factors involved in successful weight loss  Describe ways to remain physically active  Describe the impact of regular activity on insulin resistance  Patient updated their initials goals from Core Class I to include:  ReliOnGlucose tablets given in class (1 packet):  Glucose tabs not distributed  Handouts given in class:  Refrigerator magnet for Sick Day Guidelines  [facility] Oral medication/insulin handout  Follow-Up Plan: Patient will attend the final class of the ADA Diabetes Self-Care Education.

## 2012-01-06 ENCOUNTER — Encounter: Payer: 59 | Admitting: Dietician

## 2012-01-06 DIAGNOSIS — E119 Type 2 diabetes mellitus without complications: Secondary | ICD-10-CM

## 2012-01-09 NOTE — Progress Notes (Signed)
  Patient was seen on 01/06/2012 for the third of a series of three diabetes self-management courses at the [facility]. The following learning objectives were met by the patient during this course:    Describe how diabetes changes over time   Identify diabetes complications and ways to prevent them   Describe strategies that can promote heart health including lowering blood pressure and cholesterol   Describe strategies to lower dietary fat and sodium in the diet   Identify physical activities that benefit cardiovascular health   Evaluate success in meeting personal goal   Describe the belief that they can live successfully with diabetes day to day   Establish 2-3 goals that they will plan to diligently work on until they return for the free 37-month follow-up visit  The following handouts were given in class:  3 Month Follow Up Visit handout  Goal setting handout  Class evaluation form  Your patient has established the following 3 month goals for diabetes self-care:  Reduce fat in my diet by eating less food at two or more meals a day.  Be Active 30 minutes or more 3 times per week.  Follow-Up Plan: Patient will attend a 3 month follow-up visit for diabetes self-management education.

## 2012-02-19 ENCOUNTER — Other Ambulatory Visit (HOSPITAL_COMMUNITY): Payer: Self-pay | Admitting: Internal Medicine

## 2012-02-19 DIAGNOSIS — Z1231 Encounter for screening mammogram for malignant neoplasm of breast: Secondary | ICD-10-CM

## 2012-03-08 ENCOUNTER — Other Ambulatory Visit: Payer: Self-pay | Admitting: Internal Medicine

## 2012-03-17 ENCOUNTER — Ambulatory Visit (HOSPITAL_COMMUNITY): Payer: 59

## 2012-04-06 ENCOUNTER — Encounter: Payer: 59 | Attending: Internal Medicine | Admitting: Dietician

## 2012-04-06 ENCOUNTER — Encounter: Payer: Self-pay | Admitting: Dietician

## 2012-04-06 NOTE — Patient Instructions (Addendum)
   Calculating the A1C: by using testing blood glucose 2 times per day.  Check fasting levels  Get a glucose reading before lunch  Get a reading 2 hours after the first bite of lunch  Get a reading before exercise.  Get a reading before dinner.  Get a reading 2 hours after the first bite of dinner.    Try to do this varying schedule for 2 weeks. Add up all the results you have for the 2 weeks. Divide by the total by the number of test that you did. Compare this number to the number on page 17 of the Basic Book.   Support Group is the 2st Monday of each month.  Meets 6:00-7:00 PM in Conference Room of [employer] [facility] (classroom).  Questions, call [phone].

## 2012-04-15 ENCOUNTER — Ambulatory Visit (INDEPENDENT_AMBULATORY_CARE_PROVIDER_SITE_OTHER): Payer: 59 | Admitting: Ophthalmology

## 2012-04-15 ENCOUNTER — Ambulatory Visit (HOSPITAL_COMMUNITY)
Admission: RE | Admit: 2012-04-15 | Discharge: 2012-04-15 | Disposition: A | Discharge status: Home / Self Care | Payer: 59 | Source: Ambulatory Visit | Attending: Internal Medicine | Admitting: Internal Medicine

## 2012-04-15 DIAGNOSIS — E11319 Type 2 diabetes mellitus with unspecified diabetic retinopathy without macular edema: Secondary | ICD-10-CM

## 2012-04-15 DIAGNOSIS — H35039 Hypertensive retinopathy, unspecified eye: Secondary | ICD-10-CM

## 2012-04-15 DIAGNOSIS — I10 Essential (primary) hypertension: Secondary | ICD-10-CM

## 2012-04-15 DIAGNOSIS — Z1231 Encounter for screening mammogram for malignant neoplasm of breast: Secondary | ICD-10-CM | POA: Insufficient documentation

## 2012-04-15 DIAGNOSIS — E1165 Type 2 diabetes mellitus with hyperglycemia: Secondary | ICD-10-CM

## 2012-04-15 DIAGNOSIS — E1139 Type 2 diabetes mellitus with other diabetic ophthalmic complication: Secondary | ICD-10-CM

## 2012-04-15 DIAGNOSIS — H2510 Age-related nuclear cataract, unspecified eye: Secondary | ICD-10-CM

## 2012-04-15 DIAGNOSIS — E11359 Type 2 diabetes mellitus with proliferative diabetic retinopathy without macular edema: Secondary | ICD-10-CM

## 2012-04-15 DIAGNOSIS — H43819 Vitreous degeneration, unspecified eye: Secondary | ICD-10-CM

## 2012-04-16 ENCOUNTER — Ambulatory Visit (INDEPENDENT_AMBULATORY_CARE_PROVIDER_SITE_OTHER): Payer: 59 | Admitting: Ophthalmology

## 2012-04-19 ENCOUNTER — Encounter: Payer: Self-pay | Admitting: Dietician

## 2012-04-19 NOTE — Progress Notes (Signed)
  Patient was seen on 04/08/2012 for their 3 month follow-up as a part of the diabetes self-management courses at the [facility]. The following learning objectives were met by your patient during this course:  Patient self reports the following:  Diabetes control has improved since diabetes self-management training: yes Number of days blood glucose is >200: none Last MD appointment for diabetes: Due in 2 weeks Changes in treatment plan: anticipates medication change Confidence with ability to manage diabetes: increased Areas for improvement with diabetes self-care: Loose weight Willingness to participate in diabetes support group: Works in the evening  Please see Diabetes Flow sheet for findings related to patient's self-care.  Follow-Up Plan: Patient is eligible for a "free" 30 minute diabetes self-care appointment in the next year. Patient to call and schedule as needed.

## 2012-05-07 ENCOUNTER — Telehealth: Payer: Self-pay | Admitting: Dietician

## 2012-05-07 NOTE — Telephone Encounter (Signed)
Called and left message to follow up to letter sent by [facility] office asking patient to call us or stop by to schedule an appointment with her doctor.

## 2012-06-30 ENCOUNTER — Encounter: Payer: Self-pay | Admitting: Gastroenterology

## 2012-07-22 ENCOUNTER — Encounter: Payer: 59 | Attending: Internal Medicine | Admitting: Dietician

## 2012-07-22 DIAGNOSIS — Z713 Dietary counseling and surveillance: Secondary | ICD-10-CM | POA: Insufficient documentation

## 2012-07-22 DIAGNOSIS — E119 Type 2 diabetes mellitus without complications: Secondary | ICD-10-CM | POA: Insufficient documentation

## 2012-07-23 NOTE — Progress Notes (Signed)
  Patient was seen on 07/22/2012 for their second post follow-up visit as a part of the diabetes self-management courses at the [facility]. The following learning objectives were met by your patient during this course:  Patient self reports the following:  Diabetes control has improved since diabetes self-management training: Yes and no.  Was not taking her Januvia and her A1C increased to "7 something.  Has been placed on Glipizide 10 mg AC breakfast and AC dinner. She currently is taking the Metformin, Glipizide and the Januvia.   At this time she is having episodes of hypoglycemia at least 2 mornings per week.    Number of days blood glucose is >200: none Last MD appointment for diabetes: Emma Davenport 31, 2013  Changes in treatment plan: Glipizide added to medications Confidence with ability to manage diabetes: Feels more confident  Areas for improvement with diabetes self-care: 1. Get A1C to less than 7%. 2. Lose more weight.  3.  Not have low blood sugars. Willingness to participate in diabetes support group: Working 2 jobs and is not available to participate. Checking Blood Glucose:  Yes, Fasting and PM Episodes of Hypoglycemia:  Yes, usually in the early morning about 5-6 AM.  Occurs 2-3 mornings per week. Episodes of Hyperglycemia:  None noted. Checking Feet: Yes, almost every day. Exercise:  Walking on the treadmill, 3-4 times per week. Following a meal plan: yes  Please see Diabetes Flow sheet for findings related to patient's self-care.  Follow-Up Plan: Pt. Will call with questions.  She is to follow-up with Emma Davenport at [facility] and her MD.

## 2012-08-12 ENCOUNTER — Ambulatory Visit (AMBULATORY_SURGERY_CENTER): Payer: 59 | Admitting: *Deleted

## 2012-08-12 VITALS — Ht 60.0 in | Wt 168.7 lb

## 2012-08-12 DIAGNOSIS — Z1211 Encounter for screening for malignant neoplasm of colon: Secondary | ICD-10-CM

## 2012-08-12 DIAGNOSIS — Z86010 Personal history of colonic polyps: Secondary | ICD-10-CM

## 2012-08-12 MED ORDER — MOVIPREP 100 G PO SOLR: 1.0000 | Freq: Once | ORAL | Status: DC

## 2012-08-23 NOTE — Addendum Note (Signed)
Addended by: Maple Hudson on: 08/23/2012 11:43 AM   Modules accepted: Level of Service

## 2012-08-26 ENCOUNTER — Ambulatory Visit (AMBULATORY_SURGERY_CENTER): Payer: 59 | Admitting: Gastroenterology

## 2012-08-26 ENCOUNTER — Encounter: Payer: Self-pay | Admitting: Gastroenterology

## 2012-08-26 VITALS — BP 123/72 | HR 74 | Temp 97.5°F | Resp 15 | Ht 60.0 in | Wt 168.0 lb

## 2012-08-26 DIAGNOSIS — Z1211 Encounter for screening for malignant neoplasm of colon: Secondary | ICD-10-CM

## 2012-08-26 DIAGNOSIS — Z860100 Personal history of colon polyps, unspecified: Secondary | ICD-10-CM

## 2012-08-26 DIAGNOSIS — K6389 Other specified diseases of intestine: Secondary | ICD-10-CM

## 2012-08-26 MED ORDER — SODIUM CHLORIDE 0.9 % IV SOLN: 500.0000 mL | INTRAVENOUS | Status: DC

## 2012-08-26 NOTE — Patient Instructions (Signed)
YOU HAD AN ENDOSCOPIC PROCEDURE TODAY AT THE [facility]: Refer to the procedure report that was given to you for any specific questions about what was found during the examination.  If the procedure report does not answer your questions, please call your gastroenterologist to clarify.  If you requested that your care partner not be given the details of your procedure findings, then the procedure report has been included in a sealed envelope for you to review at your convenience later.  YOU SHOULD EXPECT: Some feelings of bloating in the abdomen. Passage of more gas than usual.  Walking can help get rid of the air that was put into your GI tract during the procedure and reduce the bloating. If you had a lower endoscopy (such as a colonoscopy or flexible sigmoidoscopy) you may notice spotting of blood in your stool or on the toilet paper. If you underwent a bowel prep for your procedure, then you may not have a normal bowel movement for a few days.  DIET: Your first meal following the procedure should be a light meal and then it is ok to progress to your normal diet.  A half-sandwich or bowl of soup is an example of a good first meal.  Heavy or fried foods are harder to digest and may make you feel nauseous or bloated.  Likewise meals heavy in dairy and vegetables can cause extra gas to form and this can also increase the bloating.  Drink plenty of fluids but you should avoid alcoholic beverages for 24 hours.  ACTIVITY: Your care partner should take you home directly after the procedure.  You should plan to take it easy, moving slowly for the rest of the day.  You can resume normal activity the day after the procedure however you should NOT DRIVE or use heavy machinery for 24 hours (because of the sedation medicines used during the test).    SYMPTOMS TO REPORT IMMEDIATELY: A gastroenterologist can be reached at any hour.  During normal business hours, 8:30 AM to 5:00 PM Monday through Friday,  call [phone].  After hours and on weekends, please call the GI answering service at [phone] who will take a message and have the physician on call contact you.   Following lower endoscopy (colonoscopy or flexible sigmoidoscopy):  Excessive amounts of blood in the stool  Significant tenderness or worsening of abdominal pains  Swelling of the abdomen that is new, acute  Fever of 100F or higher    FOLLOW UP: If any biopsies were taken you will be contacted by phone or by letter within the next 1-3 weeks.  Call your gastroenterologist if you have not heard about the biopsies in 3 weeks.  Our staff will call the home number listed on your records the next business day following your procedure to check on you and address any questions or concerns that you may have at that time regarding the information given to you following your procedure. This is a courtesy call and so if there is no answer at the home number and we have not heard from you through the emergency physician on call, we will assume that you have returned to your regular daily activities without incident.  SIGNATURES/CONFIDENTIALITY: You and/or your care partner have signed paperwork which will be entered into your electronic medical record.  These signatures attest to the fact that that the information above on your After Visit Summary has been reviewed and is understood.  Full responsibility of the confidentiality   of this discharge information lies with you and/or your care-partner.     

## 2012-08-26 NOTE — Progress Notes (Signed)
propofol per [facility] CRNA, see scanned intra procedure report. All meds titrated per CRNA during procedure. ewm

## 2012-08-26 NOTE — Progress Notes (Signed)
Patient did not experience any of the following events: a burn prior to discharge; a fall within the facility; wrong site/side/patient/procedure/implant event; or a hospital transfer or hospital admission upon discharge from the facility. (G8907) Patient did not have preoperative order for IV antibiotic SSI prophylaxis. (G8918)  

## 2012-08-26 NOTE — Op Note (Signed)
[facility] [address] [place], [address]   COLONOSCOPY PROCEDURE REPORT  PATIENT: Emma Davenport, Emma Davenport  MR#: [id number] BIRTHDATE: Mar 28, 1956 , 56  yrs. old GENDER: Female ENDOSCOPIST: Mardella Layman, MD, [facility] REFERRED BY: PROCEDURE DATE:  08/26/2012 PROCEDURE:   Colonoscopy, surveillance ASA CLASS:   Class III INDICATIONS:patient's personal history of adenomatous colon polyps.  MEDICATIONS: propofol (Diprivan) 300mg  IV  DESCRIPTION OF PROCEDURE:   After the risks and benefits and of the procedure were explained, informed consent was obtained.  A digital rectal exam revealed no abnormalities of the rectum.    The LB CF-[id number]  endoscope was introduced through the anus and advanced to the cecum, which was identified by both the appendix and ileocecal valve .  The quality of the prep was excellent, using MoviPrep .  The instrument was then slowly withdrawn as the colon was fully examined.     COLON FINDINGS: A normal appearing cecum, ileocecal valve, and appendiceal orifice were identified.  The ascending, hepatic flexure, transverse, splenic flexure, descending, sigmoid colon and rectum appeared unremarkable.  No polyps or cancers were seen. Moderate melanosis was found throughout the entire examined colon. Retroflexed views revealed no abnormalities.     The scope was then withdrawn from the patient and the procedure completed.  COMPLICATIONS: There were no complications. ENDOSCOPIC IMPRESSION: 1.   Normal colon ..no recurrent polyps noted from 1 year ago.No FH of colon CA,or other hx. of different cancers. 2.   Moderate melanosis was found throughout the entire examined colon  RECOMMENDATIONS:F/U EXAM 3 YEARS   REPEAT EXAM:  cc:  _______________________________ eSignedMardella Layman, MD, [facility] 08/26/2012 10:56 AM     PATIENT NAME:  Emma Davenport, Emma Davenport MR#: [id number]

## 2012-08-27 ENCOUNTER — Telehealth: Payer: Self-pay | Admitting: *Deleted

## 2012-08-27 NOTE — Telephone Encounter (Signed)
Left message on number given in admitting yesterday as directed by pt. ewm

## 2012-10-19 ENCOUNTER — Ambulatory Visit (INDEPENDENT_AMBULATORY_CARE_PROVIDER_SITE_OTHER): Payer: 59 | Admitting: Ophthalmology

## 2012-10-19 DIAGNOSIS — H43819 Vitreous degeneration, unspecified eye: Secondary | ICD-10-CM

## 2012-10-19 DIAGNOSIS — H2510 Age-related nuclear cataract, unspecified eye: Secondary | ICD-10-CM

## 2012-10-19 DIAGNOSIS — E1165 Type 2 diabetes mellitus with hyperglycemia: Secondary | ICD-10-CM

## 2012-10-19 DIAGNOSIS — H35039 Hypertensive retinopathy, unspecified eye: Secondary | ICD-10-CM

## 2012-10-19 DIAGNOSIS — I10 Essential (primary) hypertension: Secondary | ICD-10-CM

## 2012-10-19 DIAGNOSIS — E1139 Type 2 diabetes mellitus with other diabetic ophthalmic complication: Secondary | ICD-10-CM

## 2012-10-19 DIAGNOSIS — E11359 Type 2 diabetes mellitus with proliferative diabetic retinopathy without macular edema: Secondary | ICD-10-CM

## 2013-01-20 ENCOUNTER — Ambulatory Visit: Payer: 59 | Admitting: *Deleted

## 2013-01-31 ENCOUNTER — Encounter: Payer: Commercial Managed Care - PPO | Admitting: Obstetrics and Gynecology

## 2013-02-03 ENCOUNTER — Encounter: Payer: 59 | Attending: Internal Medicine | Admitting: *Deleted

## 2013-02-03 DIAGNOSIS — Z713 Dietary counseling and surveillance: Secondary | ICD-10-CM | POA: Insufficient documentation

## 2013-02-03 DIAGNOSIS — E119 Type 2 diabetes mellitus without complications: Secondary | ICD-10-CM | POA: Insufficient documentation

## 2013-02-03 NOTE — Progress Notes (Signed)
  Medical Nutrition Therapy:  Appt start time: 0845 end time:  0945.  Assessment:  Primary concerns today: diabetes follow up visit. She attended the [employer] in 2013. Lives with husband who helps with cooking occasionally. 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 Enjoys babysitting young grand children, cleaning her house, and using the treadmill either at work or she has one at home. She states she SMBG twice daily but misses the pre breakfast if she doesn't have time. She also states she is not having any hypoglycemia lately and typical range of BG's are 120-200 mg/dl which is higher than usual due to not having enough time to exercise. She requests a review of Carb Counting today as well.  MEDICATIONS: see list. Diabetes medications include Metformin, Glipizide and Janumet   DIETARY INTAKE:  Usual eating pattern includes 3 meals and 1-2 snacks per day.  Everyday foods include good variety of all food groups.  Avoided foods include noodles because they raise her BG.    24-hr recall:  B (8 AM): oatmeal x 2 scoops, 2% milk and cinnamon and 1/2 tsp sugar, coffee with milk and Splenda Snk ( AM): none  L (1:30 PM): eats food from cafe: vegetable plate OR salad poured [place] Dressing or Lime Juice, water or diet soda Snk (5 PM): whatever she picks up, not planned D (9 PM): cooks own meals: lean meat, starch, vegetables and African food that is corn meal and flour mixed and cooked on stove (like mashed potato), red wine or water Snk ( PM): none Beverages: coffee, water and occasionally red wine with dinner  Usual physical activity: treadmill  Estimated energy needs: 1200 calories 135 g carbohydrates 90 g protein 33 g fat  Progress Towards Goal(s):  In progress.   Nutritional Diagnosis:  NB-1.1 Food and nutrition-related knowledge deficit As related to diabetes management.  As evidenced by stated  A1c of 7.1%.    Intervention:  Nutrition counseling and diabetes education provided. Discussed SMBG and rationale of checking BG at alternate times of day,  Carb Counting in terms of food groups and reading food labels. Encouraged her to resume her walks on the treadmill as tolerated regarding the benefits of increased activity. . Plan:  Aim for 2 Carb Choices per meal (30 grams) +/- 1 either way  Aim for 0-1 Carbs per snack if hungry  Consider reading food labels for Total Carbohydrate of foods Consider  increasing your activity level by walking on treadmill or outside for 15-30 minutes daily as tolerated Consider checking BG at alternate times per day as directed by MD  Consider checking your BG before exercise and if it is below 120 mg/dl, have a 1 Carb snack before the exercise     Handouts given during visit include: Carb Counting and Food Label handouts Meal Plan Card  Monitoring/Evaluation:  Dietary intake, exercise, reading food labels, and body weight prn. Patient planning a trip to [place] in July and is scheduled to see Gilman Buttner with [facility] in June, so patient plans to call for appointment when she returns to [place].

## 2013-02-03 NOTE — Patient Instructions (Addendum)
Plan:  Aim for 2 Carb Choices per meal (30 grams) +/- 1 either way  Aim for 0-1 Carbs per snack if hungry  Consider reading food labels for Total Carbohydrate of foods Consider  increasing your activity level by walking on treadmill or outside for 15-30 minutes daily as tolerated Consider checking BG at alternate times per day as directed by MD  Consider checking your BG before exercise and if it is below 120 mg/dl, have a 1 Carb snack before the exercise

## 2013-02-17 ENCOUNTER — Ambulatory Visit (INDEPENDENT_AMBULATORY_CARE_PROVIDER_SITE_OTHER): Payer: 59 | Admitting: Ophthalmology

## 2013-03-15 ENCOUNTER — Ambulatory Visit (INDEPENDENT_AMBULATORY_CARE_PROVIDER_SITE_OTHER): Payer: 59 | Admitting: Ophthalmology

## 2013-03-15 DIAGNOSIS — H35039 Hypertensive retinopathy, unspecified eye: Secondary | ICD-10-CM

## 2013-03-15 DIAGNOSIS — E1165 Type 2 diabetes mellitus with hyperglycemia: Secondary | ICD-10-CM

## 2013-03-15 DIAGNOSIS — E11359 Type 2 diabetes mellitus with proliferative diabetic retinopathy without macular edema: Secondary | ICD-10-CM

## 2013-03-15 DIAGNOSIS — E1139 Type 2 diabetes mellitus with other diabetic ophthalmic complication: Secondary | ICD-10-CM

## 2013-03-15 DIAGNOSIS — I10 Essential (primary) hypertension: Secondary | ICD-10-CM

## 2013-03-15 DIAGNOSIS — H2510 Age-related nuclear cataract, unspecified eye: Secondary | ICD-10-CM

## 2013-03-31 ENCOUNTER — Other Ambulatory Visit (HOSPITAL_COMMUNITY): Payer: Self-pay | Admitting: Internal Medicine

## 2013-03-31 DIAGNOSIS — Z1231 Encounter for screening mammogram for malignant neoplasm of breast: Secondary | ICD-10-CM

## 2013-04-19 ENCOUNTER — Ambulatory Visit (HOSPITAL_COMMUNITY)
Admission: RE | Admit: 2013-04-19 | Discharge: 2013-04-19 | Disposition: A | Discharge status: Home / Self Care | Payer: 59 | Source: Ambulatory Visit | Attending: Internal Medicine | Admitting: Internal Medicine

## 2013-04-19 DIAGNOSIS — Z1231 Encounter for screening mammogram for malignant neoplasm of breast: Secondary | ICD-10-CM | POA: Insufficient documentation

## 2013-05-12 ENCOUNTER — Ambulatory Visit (INDEPENDENT_AMBULATORY_CARE_PROVIDER_SITE_OTHER): Payer: 59 | Admitting: Family Medicine

## 2013-05-12 DIAGNOSIS — E1110 Type 2 diabetes mellitus with ketoacidosis without coma: Secondary | ICD-10-CM

## 2013-05-12 DIAGNOSIS — E1310 Other specified diabetes mellitus with ketoacidosis without coma: Secondary | ICD-10-CM

## 2013-05-12 NOTE — Progress Notes (Signed)
Subjective/Objective: Emma Davenport comes to [facility] appointment [facility] for 3 month f/u for Type 2 , HTN, HLD. Patient was diagnosed with T2DM in 1999. Patient is very pleased with her recent A1c results of 7.5% and a weight loss of 3 pounds since her last doctor appointment in May. Patient also reports that she will be leaving for [place] for 8 weeks on July 29th. Admits that diet and exercise are going to be a challenge while she is away.   Patient reports that diet hasn't changed much since last visit. She is still eating oatmeal for breakfast, salads for lunch, and meat/vegetable/rice for dinner. Her carbohydrate intake currently is more than 45 grams at breakfast and dinner. Patient is not exercising as much as she had been previously. However, patient states that she wants to start walking 2-3 times/week on the treadmill and will try to increase the number of days she walks per week.   Denies any episodes of hypoglycemia but does have hypoglycemia awareness. Checks blood glucose two times daily (fasting and 2 hours post-prandial in the evening). Eye exam in April was normal. Checks feet daily. Patient has not been to the dentist this year. States that she will go to the dentist while she is in [place].  Currently takes metformin, Januvia, Actos, and glipizide for management of Type 2 DM. Patient takes lisinopril/HCTZ for kidney protection and blood pressure. Takes a daily aspirin. Takes atorvastatin for cholesterol.   BP 132/77; P 60, Ht 5'1", Wt 171, A1c 7.5% (patient reported); home fasting blood glucose 84-90; 2-hour post-prandial blood glucose 145-150.  Assessment: Type 2 DM Patient is not at goal with an A1c of 7.5%. Patient is adhering to diet approximately 50-75% of the time. She is consuming more than 45 grams of carbohydrates at breakfast and dinner. Suggested substitutes in order to decrease carbohydrate intake at each meal. Discussed having a smaller bowl of oatmeal for  breakfast with yogurt or eggs. Also talked to patient about snacking in between meals with a goal of 15 grams of carbs/snack. Examples of snacks include yogurt, fruit, protein bar [facility] nuts & dark chocolate). Advised patient to keep snacks readily available at her desk. Patient was exercising 2-3 times a week previously, but she has decreased to 1-2 times/week due to a busier schedule. She is going to try to start exercising at least 2 days (possibly 3 days) a week. Discussed the impact that lifestyle modifications alone can have on A1c. Patient seemed encouraged when we discussed various dietary changes and exercise to decrease her A1c and have better control of her diabetes.   HTN Patient's blood pressure is under good control. Home blood pressure readings are normally 120/70. Blood pressure was slightly elevated at today's visit (132/77), but overall blood pressure stays within the normal limits. Patient is taking lisinopril/HCTZ for blood pressure control as well as kidney protection.  Hypercholesterolemia Patient's previous lipid profile revealed elevated LDL, TC, and TG levels. Patient was taking Crestor and has now changed to atorvastatin for cost savings. I have requested a copy of patient's most recent lab work from Dr. Idelle Crouch office.    Plan: 1. Try to eat snacks in between meals (no more than 15 grams of carbohydrates). 2. Increase exercise to 2-3 times a week. 3. Follow up with Emma Davenport, PharmD at the Outpatient [facility] in 3 months on Thursday, October 2nd at 10:00am.

## 2013-05-19 ENCOUNTER — Other Ambulatory Visit: Payer: Self-pay | Admitting: *Deleted

## 2013-05-19 NOTE — Telephone Encounter (Signed)
Not a pt in [facility]

## 2013-05-25 ENCOUNTER — Encounter (INDEPENDENT_AMBULATORY_CARE_PROVIDER_SITE_OTHER): Payer: 59 | Admitting: Ophthalmology

## 2013-05-25 DIAGNOSIS — E1139 Type 2 diabetes mellitus with other diabetic ophthalmic complication: Secondary | ICD-10-CM

## 2013-05-25 DIAGNOSIS — H2510 Age-related nuclear cataract, unspecified eye: Secondary | ICD-10-CM

## 2013-05-25 DIAGNOSIS — E11359 Type 2 diabetes mellitus with proliferative diabetic retinopathy without macular edema: Secondary | ICD-10-CM

## 2013-05-25 DIAGNOSIS — H4310 Vitreous hemorrhage, unspecified eye: Secondary | ICD-10-CM

## 2013-05-25 DIAGNOSIS — I10 Essential (primary) hypertension: Secondary | ICD-10-CM

## 2013-05-25 DIAGNOSIS — H35039 Hypertensive retinopathy, unspecified eye: Secondary | ICD-10-CM

## 2013-05-26 ENCOUNTER — Other Ambulatory Visit: Payer: Self-pay

## 2013-07-20 ENCOUNTER — Encounter (HOSPITAL_COMMUNITY): Payer: Self-pay | Admitting: Adult Health

## 2013-07-20 ENCOUNTER — Emergency Department (HOSPITAL_COMMUNITY)
Admission: EM | Admit: 2013-07-20 | Discharge: 2013-07-20 | Disposition: A | Discharge status: Home / Self Care | Payer: 59 | Attending: Emergency Medicine | Admitting: Emergency Medicine

## 2013-07-20 DIAGNOSIS — Y929 Unspecified place or not applicable: Secondary | ICD-10-CM | POA: Insufficient documentation

## 2013-07-20 DIAGNOSIS — T63461A Toxic effect of venom of wasps, accidental (unintentional), initial encounter: Secondary | ICD-10-CM | POA: Insufficient documentation

## 2013-07-20 DIAGNOSIS — Z7982 Long term (current) use of aspirin: Secondary | ICD-10-CM | POA: Insufficient documentation

## 2013-07-20 DIAGNOSIS — Z860100 Personal history of colon polyps, unspecified: Secondary | ICD-10-CM | POA: Insufficient documentation

## 2013-07-20 DIAGNOSIS — Y939 Activity, unspecified: Secondary | ICD-10-CM | POA: Insufficient documentation

## 2013-07-20 DIAGNOSIS — M129 Arthropathy, unspecified: Secondary | ICD-10-CM | POA: Insufficient documentation

## 2013-07-20 DIAGNOSIS — I10 Essential (primary) hypertension: Secondary | ICD-10-CM | POA: Insufficient documentation

## 2013-07-20 DIAGNOSIS — Z79899 Other long term (current) drug therapy: Secondary | ICD-10-CM | POA: Insufficient documentation

## 2013-07-20 DIAGNOSIS — E119 Type 2 diabetes mellitus without complications: Secondary | ICD-10-CM | POA: Insufficient documentation

## 2013-07-20 DIAGNOSIS — T6391XA Toxic effect of contact with unspecified venomous animal, accidental (unintentional), initial encounter: Secondary | ICD-10-CM | POA: Insufficient documentation

## 2013-07-20 DIAGNOSIS — E785 Hyperlipidemia, unspecified: Secondary | ICD-10-CM | POA: Insufficient documentation

## 2013-07-20 DIAGNOSIS — Z8742 Personal history of other diseases of the female genital tract: Secondary | ICD-10-CM | POA: Insufficient documentation

## 2013-07-20 LAB — GLUCOSE, CAPILLARY: Glucose-Capillary: 328 mg/dL — ABNORMAL HIGH (ref 70–99)

## 2013-07-20 MED ORDER — DIPHENHYDRAMINE HCL 25 MG PO CAPS
25.0000 mg | ORAL_CAPSULE | Freq: Once | ORAL | Status: AC
  Administered 2013-07-20: 25 mg via ORAL
  Filled 2013-07-20: qty 1

## 2013-07-20 MED ORDER — PREDNISONE 20 MG PO TABS: 40.0000 mg | ORAL_TABLET | Freq: Every day | ORAL | Status: DC

## 2013-07-20 MED ORDER — PREDNISONE 20 MG PO TABS
60.0000 mg | ORAL_TABLET | Freq: Once | ORAL | Status: AC
  Administered 2013-07-20: 60 mg via ORAL
  Filled 2013-07-20: qty 3

## 2013-07-20 NOTE — ED Notes (Signed)
Presents one hour post multiple bee stings and fall onto front porch. Sung on top of head, in left eye, on right arm. apin described as burning, sclera edema noted to left eye. HR 118, bilateral breath sounds clear. Just returned from [place] this evening.

## 2013-07-20 NOTE — ED Notes (Signed)
Pt requesting to be released. Dr. Rubin Payor notiefeid, feeling better,sclera edema gone.

## 2013-07-21 NOTE — ED Provider Notes (Signed)
CSN: [id number]     Arrival date & time 07/20/13  1958 History   First MD Initiated Contact with Patient 07/20/13 2007     Chief Complaint  Patient presents with  . multiple bee stings    (Consider location/radiation/quality/duration/timing/severity/associated sxs/prior Treatment) The history is provided by the patient.   patient had return of travel and was waiting in front of her house. She states she was stung by several "flying ants" she was stung in several places, including her scalp and possibly her eye. No difficulty breathing. She does itch and has a rash. No serious allergy history.  Past Medical History  Diagnosis Date  . Allergy     allergic rhinitis  . Hypertension   . Hyperlipidemia   . Menorrhagia   . Arthritis   . Adenomatous polyp of colon 2005  . Diabetes mellitus 2002    T2DM   Past Surgical History  Procedure Laterality Date  . Abdominal hysterectomy    . Cystoscopy    . Colonoscopy    . Polypectomy     Family History  Problem Relation Age of Onset  . Heart disease Neg Hx     premature CAD  . Colon cancer Neg Hx   . Esophageal cancer Neg Hx   . Rectal cancer Neg Hx   . Stomach cancer Neg Hx    History  Substance Use Topics  . Smoking status: Never Smoker   . Smokeless tobacco: Never Used  . Alcohol Use: Yes     Comment: occassional 2 beer a month   OB History   Grav Para Term Preterm Abortions TAB SAB Ect Mult Living                 Review of Systems  Constitutional: Negative for activity change and appetite change.  HENT: Negative for neck stiffness.   Eyes: Negative for pain.  Respiratory: Negative for chest tightness, shortness of breath and stridor.   Cardiovascular: Negative for chest pain and leg swelling.  Gastrointestinal: Negative for nausea, vomiting, abdominal pain and diarrhea.  Genitourinary: Negative for flank pain.  Musculoskeletal: Negative for back pain.  Skin: Positive for rash.  Neurological: Negative for weakness,  numbness and headaches.  Psychiatric/Behavioral: Negative for behavioral problems.    Allergies  Review of patient's allergies indicates no known allergies.  Home Medications   Current Outpatient Rx  Name  Route  Sig  Dispense  Refill  . aspirin 81 MG tablet   Oral   Take 81 mg by mouth daily.           Marland Kitchen atorvastatin (LIPITOR) 20 MG tablet   Oral   Take 20 mg by mouth every evening.         Marland Kitchen glipiZIDE (GLUCOTROL) 10 MG tablet   Oral   Take 10 mg by mouth 2 (two) times daily before a meal.         . lisinopril-hydrochlorothiazide (PRINZIDE,ZESTORETIC) 20-12.5 MG per tablet   Oral   Take 2 tablets by mouth daily.         . metFORMIN (GLUCOPHAGE) 1000 MG tablet   Oral   Take 1,000 mg by mouth 2 (two) times daily with a meal.         . pioglitazone (ACTOS) 30 MG tablet   Oral   Take 30 mg by mouth daily.         . rosuvastatin (CRESTOR) 20 MG tablet   Oral   Take 1 tablet (20 mg total)  by mouth daily.   31 tablet   11   . sitaGLIPtin (JANUVIA) 100 MG tablet   Oral   Take 100 mg by mouth daily.         . predniSONE (DELTASONE) 20 MG tablet   Oral   Take 2 tablets (40 mg total) by mouth daily.   4 tablet   0    BP 137/101  Pulse 99  Temp(Src) 98.4 F (36.9 C) (Oral)  Resp 16  SpO2 97% Physical Exam  Nursing note and vitals reviewed. Constitutional: She is oriented to person, place, and time. She appears well-developed and well-nourished.  HENT:  Head: Normocephalic and atraumatic.  Mouth/Throat: No oropharyngeal exudate.  Eyes: EOM are normal. Pupils are equal, round, and reactive to light.  Subconjunctiva edema on left.  Neck: Normal range of motion. Neck supple.  Cardiovascular: Normal rate, regular rhythm and normal heart sounds.   No murmur heard. Pulmonary/Chest: Effort normal and breath sounds normal. No respiratory distress. She has no wheezes. She has no rales.  Abdominal: Soft. Bowel sounds are normal. She exhibits no  distension. There is no tenderness. There is no rebound and no guarding.  Musculoskeletal: Normal range of motion.  Neurological: She is alert and oriented to person, place, and time. No cranial nerve deficit.  Skin: Skin is warm and dry. Rash noted.  Hives to bilateral hands and forearms.  Psychiatric: She has a normal mood and affect. Her speech is normal.    ED Course  Procedures (including critical care time) Labs Review Labs Reviewed  GLUCOSE, CAPILLARY - Abnormal; Notable for the following:    Glucose-Capillary 328 (*)    All other components within normal limits   Imaging Review No results found.  MDM   1. Allergic reaction to bee sting, initial encounter    Patient is allergic reaction to bee sting. Patient's symptoms are improved. Will be given steroids. Patient be discharged home    Juliet Rude. Rubin Payor, MD 07/21/13 [id number]

## 2013-08-08 NOTE — Progress Notes (Signed)
Patient ID: Emma Davenport, female   DOB: [birth year], 57 y.o.   MRN: [id number] ATTENDING PHYSICIAN NOTE: I have reviewed the chart and agree with the plan as detailed above. Denny Levy MD Pager [phone]

## 2013-08-18 ENCOUNTER — Ambulatory Visit (INDEPENDENT_AMBULATORY_CARE_PROVIDER_SITE_OTHER): Payer: 59 | Admitting: Family Medicine

## 2013-08-18 DIAGNOSIS — E119 Type 2 diabetes mellitus without complications: Secondary | ICD-10-CM

## 2013-08-18 NOTE — Progress Notes (Signed)
Subjective/Objective:  Patient presents today for her three month DM follow-up through the employee-sponsored Link to Wellness program at [facility]. Patient has just returned from a visit to [place] for 8 weeks. Admits that her recent visit with her physician revealed an increased A1c. Patient left one of her diabetes medications at home while visiting [place], and she attributes the increase in A1c to this. Patient has her 39-month-old grandson with her, which made communicating at this appointment rather difficult a times.  A1c 8.0% (08/04/13), Wt 166 lbs, BP 131/69, P 82  Patient did not bring glucometer to appointment today, so I do not have any blood glucose readings. Patient recalls fasting blood glucose readings of 100-115 and evening blood glucose readings of 120-130.  Assessment:  Patient reports an increase in A1c from 7.5% to 8.0% (08/04/13) since last appointment. She attributes this increase due to the fact that she forgot one of her diabetes medications at home while traveling to [place] for 8 weeks. Patient is currently taking Actos, metformin, glipizide, and Januvia for diabetes. Patient has lost 4 pounds since returning from [place]. She states that her diet is more controlled, and she is trying to exercise although that has been difficult since returning from [place]. Patient checks blood sugar two times daily (morning and evening). Did not bring glucometer to appointment but recalls fasting blood glucose readings of 100-115 and evening blood glucose readings of 120-130. Reports one recent episode of hypoglycemia with a blood glucose of 65. Patient woke up sweating and weak. Corrected low blood glucose with glucose tablets. Patient has hypoglycemia awareness. Patient checks feet daily. Sees dentist every 6 months. Had yearly eye exam in July. Optometrist found a "blood clot" in her left eye and suggested surgery. Patient is refusing surgery. She saw an optometrist in [place] who  prescribed an "eye medication." Patient was instructed to use this medication for 6 months and surgery would not be needed. She does not know the name of the medication prescribed. Patient is currently taking an ACE-inhibitor and daily aspirin. She is scheduled to follow up with Dr. Nehemiah Settle 12/05/13.  Plan:  Return to [facility] in three months for follow-up on Thursday, January 22nd at 10:00am.

## 2013-09-14 ENCOUNTER — Ambulatory Visit (INDEPENDENT_AMBULATORY_CARE_PROVIDER_SITE_OTHER): Payer: 59 | Admitting: Ophthalmology

## 2013-09-15 ENCOUNTER — Ambulatory Visit (INDEPENDENT_AMBULATORY_CARE_PROVIDER_SITE_OTHER): Payer: 59 | Admitting: Ophthalmology

## 2013-12-08 ENCOUNTER — Ambulatory Visit (INDEPENDENT_AMBULATORY_CARE_PROVIDER_SITE_OTHER): Payer: Self-pay | Admitting: Family Medicine

## 2013-12-08 DIAGNOSIS — E119 Type 2 diabetes mellitus without complications: Secondary | ICD-10-CM

## 2013-12-08 NOTE — Progress Notes (Signed)
Subjective/Objective:  Patient presents today for her three month DM follow-up through the employee-sponsored Link to Wellness program at [facility]. Patient just saw Dr. Delfina Redwood for primary care visit. No changes were made at the visit. Patient has admitted that she has gained some weight since last visit, but her A1c actually decreased since our last visit. Patient is currently taking glipizde, metformin, Januvia, and Actos for diabetes management.   A1c: 6.8% (12/05/13); BP 153/81 mmHg; Pulse 54 bpm; Ht 5 ft 1 in; Wt 177 pounds; BMI 33.4    Assessment:  Patient visited her PCP Dr. Delfina Redwood on 12/05/13, and we just received lab work results. A1c was reported as 6.8% (down from 8.0% on 08/04/13). Patient is currently taking Actos, metformin, glipizide, and Januvia for diabetes. Patient has gained weight since last visit. She states that her diet is more controlled, but she has not been exercising. Patient checks blood sugar two times daily (morning and evening). She did not bring glucometer to appointment but recalls fasting blood glucose readings of 120-125 and evening blood glucose readings of 140-160. Reports several recent episodes of hypoglycemia with blood glucose readings of 50-67. Patient woke up feeling "shaky." Corrected low blood glucose with glucose tablets. Patient does has hypoglycemia awareness. Patient checks feet daily. Sees dentist every 6 months. Had yearly eye exam in July. Patient is currently taking an ACE-inhibitor. She is scheduled to follow up with Dr. Delfina Redwood 04/10/13.   Plan:  Patient's personal goals are as follows:  1. Start exercising 3 times a week (~30 minutes).  2. Eat snack at bedtime to prevent low blood sugar in the early morning.  We are going to try having patient eat a snack at bedtime to see if hypoglycemia episodes decrease. If they do not, we will try skipping the evening glipizide dose since patient does not take the evening dose until 9:00pm when  she eats dinner. I suggested possibly changing glipizide to glipizide ER, so that patient can take the medication in the morning only. Patient did not want to change medication until she tried the other interventions first. I will follow up with patient in approximately 3 weeks to check frequency of hypoglycemic episodes with the aforementioned interventions.

## 2013-12-22 NOTE — Progress Notes (Signed)
Patient ID: Emma Davenport, female   DOB: [birth year], 58 y.o.   MRN: [id number] ATTENDING PHYSICIAN NOTE: I have reviewed the chart and agree with the plan as detailed above. Dorcas Mcmurray MD Pager [phone]

## 2014-03-09 NOTE — Progress Notes (Signed)
Patient ID: Emma Davenport, female   DOB: 1956-09-01, 58 y.o.   MRN: [id number] ATTENDING PHYSICIAN NOTE: I have reviewed the chart and agree with the plan as detailed above. Dorcas Mcmurray MD Pager [phone]

## 2014-05-01 ENCOUNTER — Telehealth: Payer: Self-pay | Admitting: Family Medicine

## 2014-05-10 NOTE — Telephone Encounter (Signed)
Left Message - Called patient to come in for lab work. Please schedule for cholesterol and A1C test as part of her diabetes care.

## 2014-06-22 ENCOUNTER — Other Ambulatory Visit (HOSPITAL_COMMUNITY): Payer: Self-pay | Admitting: Internal Medicine

## 2014-06-22 DIAGNOSIS — Z1231 Encounter for screening mammogram for malignant neoplasm of breast: Secondary | ICD-10-CM

## 2014-06-27 ENCOUNTER — Ambulatory Visit (HOSPITAL_COMMUNITY)
Admission: RE | Admit: 2014-06-27 | Discharge: 2014-06-27 | Disposition: A | Discharge status: Home / Self Care | Payer: 59 | Source: Ambulatory Visit | Attending: Internal Medicine | Admitting: Internal Medicine

## 2014-06-27 DIAGNOSIS — Z1231 Encounter for screening mammogram for malignant neoplasm of breast: Secondary | ICD-10-CM | POA: Insufficient documentation

## 2014-08-17 ENCOUNTER — Ambulatory Visit (INDEPENDENT_AMBULATORY_CARE_PROVIDER_SITE_OTHER): Payer: Self-pay | Admitting: Family Medicine

## 2014-08-17 VITALS — BP 122/58 | Wt 173.0 lb

## 2014-08-17 DIAGNOSIS — E119 Type 2 diabetes mellitus without complications: Secondary | ICD-10-CM

## 2014-08-17 NOTE — Progress Notes (Signed)
Patient presents today for diabetes follow-up as part of the employer-sponsored Link to Wellness program. Current diabetes regimen includes pioglitazone, metformin, glipizide, and januvia. Patient also continues on daily ASA, ACEi, and statin. Most recent MD follow-up was May 2015. Patient has a pending appt for November 2015. No med changes or major health changes at this time.  Diabetes Assessment: Type of Diabetes: Type 2; Year of diagnosis 1999; MD managing Diabetes Seward Carol; Sees Diabetes provider 2 times per year; Diabetes Education Completed [employer] group classes; checks feet daily; takes medications as prescribed; takes an aspirin a day; uses glucometer Accu Ck compact plus. Did not bring meter to today's appointment.; What are the signs and symptoms of hypoglycemia? Dizzy, Shaky; checks blood glucose once daily; A1c 7.5 at most recent MD appt Other Diabetes History: Current med regimen includes Metformin 1000 mg twice daily, Januvia 100 mg daily, glipizide 10 mg twice daily, and pioglitazone 30 mg daily. Patient reports medication compliance, but is slightly behind on refills. Patient did not bring meter today but is currently testing 1 time per day. Glucose monitoring occurs fasting or before bed. Per patient report, glucose ranges 80-160s. Hypoglycemia frequency is rare. Patient does demonstrate appropriate correction of hypoglycemia. Patient denies signs and symptoms of neuropathy including numbness/tingling/burning and symptoms of foot infection. She does report intermittent pain in hands and fingers, but is not sure if this is due to diabetes or arthritis, MD is aware. Patient is not due for yearly eye exam.  Lifestyle Factors: Diet - Patient reports efforts to maintain a healthy diet. Food recall is as follows: Breakfast - cereal, oatmeal, coffee Lunch - eat in [employer] - cook, rice, chicken, salad, green beans Sweets - only on occassion beverages - water, tea sometimes Exercise  - Three times per week, walk on treadmill for 30 minutes.   Assessment: Patient presents today for Link to Wellness follow-up. She was previously managed by Nile Dear, but is transitioning to myself as CCM. Patient is doing well, but has experienced increases in glucose and increased A1c. A1c is now 7.5. Patient does a great job of exercising three days per week, but believes she can make dietary improvements. She will work to reduce portion size of starchy carbs. Follow-up in 3 months.  Plan: 1) Continue to make healthy dietary choices. 2) Decrease portion sizes of starchy carbohydrates such as potatoes or rice 3) Continue to exercise three days per week 4) Keep next apt with Dr. Delfina Redwood 5) Follow-up with Link to Wellness in 3 months on Tuesday Jan 5th @ 9:30 am

## 2014-10-03 ENCOUNTER — Encounter: Payer: Self-pay | Admitting: Family Medicine

## 2014-10-03 NOTE — Progress Notes (Signed)
Patient ID: Emma Davenport, female   DOB: 1956/08/06, 58 y.o.   MRN: [id number] Reviewed: Agree with the documentation and management of our [place].

## 2014-11-28 ENCOUNTER — Ambulatory Visit (INDEPENDENT_AMBULATORY_CARE_PROVIDER_SITE_OTHER): Payer: Self-pay | Admitting: Family Medicine

## 2014-11-28 VITALS — BP 118/66 | Wt 176.0 lb

## 2014-11-28 DIAGNOSIS — E119 Type 2 diabetes mellitus without complications: Secondary | ICD-10-CM

## 2014-11-28 NOTE — Progress Notes (Signed)
Patient presents today for diabetes follow-up as part of the employer-sponsored Link to Wellness program. Current diabetes regimen includes pioglitazone, metformin, glipizide, and januvia. Patient also continues on daily ASA, ACEi, and statin. Most recent MD follow-up was Nov 2015. Patient has a pending appt for 4 mo follow-up in Mar 2016. No med changes or major health changes at this time.  Diabetes Assessment: Type of Diabetes: Type 2; Year of diagnosis 1999; MD managing Diabetes Emma Davenport; Sees Diabetes provider 2 times per year; checks feet daily; takes medications as prescribed; takes an aspirin a day; uses glucometer Accu Ck compact plus. Did not bring meter to today's appointment; What are the signs and symptoms of hypoglycemia? Dizzy, Shaky; checks blood glucose 2 times a day; hypoglycemia frequency once weekly Other Diabetes History: Current med regimen includes Metformin 1000 mg twice daily, Januvia 100 mg daily, glipizide 10 mg twice daily, and pioglitazone 30 mg daily. Patient reports medication compliance, but continues to be slightly behind on refills. Patient did not bring meter today but reports testing 2 times per day. She is using Accucheck supplies and has enough for 2-3 month, we will replace with True Result meter at next appt in April.  Glucose monitoring occurs fasting and before bed. Per patient report, glucose ranges 80-160s. Hypoglycemia frequency is occasional and occurs at night, awakening her from sleep with dizziness and light headedness. She does not test, but treats appropriately with glucose tablets. Patient denies signs and symptoms of neuropathy including numbness/tingling/burning and symptoms of foot infection. She does report intermittent pain in hands and fingers, but is not sure if this is due to diabetes or arthritis, MD is aware. Patient is not due for yearly eye exam. She reports recent cataract surgery in Sept 2015 and potential for laser surgery in 2016.    Lifestyle Factors: Diet - Patient denies any major changes to diet and reports efforts to maintain a healthy diet. Food recall is as follows: Breakfast - cereal, oatmeal, coffee Lunch - eat in [employer] - cook, rice, chicken, salad, green beans Sweets - only on occassion beverages - water, tea sometimes Exercise - In the past patient was exercising three times per week, on treadmill for 30 minutes; however, she has reduced this to once weekly due to lack of time. She would like to resume exercising three times per week.   Plan: 1) Continue to make healthy dietary choices. 2) Continue to exercise and increase to walking two times per week 3) Keep next apt with Dr. Delfina Redwood 4) Follow-up with Link to Wellness in 3 months on Tuesday April 12th @ 11:00 am

## 2014-12-26 ENCOUNTER — Encounter: Payer: Self-pay | Admitting: Family Medicine

## 2014-12-26 NOTE — Progress Notes (Signed)
Patient ID: Emma Davenport, female   DOB: [birth year], 59 y.o.   MRN: [id number] Reviewed: Agree with documentation and management of our [facility] pharmacologist.

## 2015-02-27 ENCOUNTER — Ambulatory Visit: Payer: 59 | Admitting: Pharmacist

## 2015-03-01 ENCOUNTER — Ambulatory Visit: Payer: Self-pay | Admitting: Pharmacist

## 2015-03-01 ENCOUNTER — Ambulatory Visit (INDEPENDENT_AMBULATORY_CARE_PROVIDER_SITE_OTHER): Payer: Self-pay | Admitting: Family Medicine

## 2015-03-01 VITALS — BP 112/64 | Ht 62.0 in | Wt 173.0 lb

## 2015-03-01 DIAGNOSIS — E119 Type 2 diabetes mellitus without complications: Secondary | ICD-10-CM

## 2015-03-01 NOTE — Patient Instructions (Addendum)
1)  Increase exercise to three times per week 2)  Weight loss goal of 3 lbs (1 lb per month) 3)  Continue to maintain healthy dietary changes, especially limiting starches such as rice 4)  Schedule a 6 month dental exam 5)  Follow-up in 3 months on Thursday July 14th @ 11:00 am  Great to see you today!

## 2015-03-01 NOTE — Progress Notes (Signed)
Reduced rice to three times per week vs daily. Sweets only on occasion  Hypo on occssion depending on size of brkfast  Subjective:  Patient presents today for 3 month diabetes follow-up as part of the employer-sponsored Link to Wellness program.  Current diabetes regimen includes Metformin, glipizide, januvia, and pioglitazone.  Patient also continues on daily ASA, ACE Inhibitor and statin.  Most recent MD follow-up was March 2015.  Patient has a pending appt for follow-up in 4 months.  No med changes or major health changes at this time.    Assessment/Plan:  Patient is a 59 yo female with DM type 2. Patient cannot recall most recent A1c but goal remains less than 7%.  Weight has decreased since last visit but patient has set goal to continue to lose weight. Patient did not bring meter today but is currently using AccuCheck supplies.  These are no longer covered by [employer] so patient has agreed to change to TrueTest meter and supplies.  Per patient report she is testing twice daily, fasting and after the evening meal.  She reports fasting glucose averaging 120-130.  Patient reports medication adherence but refill history indicates otherwise.  I have emphasized importance of taking medications daily.  Patient denies signs and symptoms of neuropathy.  Eye exam is up to date.  Dental exam is past due.      Lifestyle:  Physical Activity-  Uses treadmill at [facility] fitness center twice per week for 15-30 minutes each time.  MD would like patient to increase exercise to three times weekly and patient has agreed to work toward this goal.   Nutrition-  Patient has made efforts to improve diet by decreasing rice to three times per week vs daily.  She will continue to maintain healthy dietary changes and will continue to monitor portion sizes.    Goals for Next Visit:  1)  Increase exercise to three times per week 2)  Weight loss goal of 3 lbs (1 lb per month) 3)  Continue to maintain healthy dietary changes,  especially limiting starches such as rice 4)  Schedule a 6 month dental exam 5)  Follow-up in 3 months on Thursday July 14th @ 11:00 am  Great to see you today!   Tilman Neat, PharmD Link to [facility]

## 2015-03-09 NOTE — Progress Notes (Signed)
Patient ID: Griffin Basil, female   DOB: [birth year], 59 y.o.   MRN: [id number] ATTENDING PHYSICIAN NOTE: I have reviewed the chart and agree with the plan as detailed above. Dorcas Mcmurray MD Pager [phone]

## 2015-03-26 ENCOUNTER — Other Ambulatory Visit (HOSPITAL_COMMUNITY): Payer: Self-pay | Admitting: Internal Medicine

## 2015-03-26 DIAGNOSIS — Z1231 Encounter for screening mammogram for malignant neoplasm of breast: Secondary | ICD-10-CM

## 2015-03-28 ENCOUNTER — Encounter: Payer: Self-pay | Admitting: Gastroenterology

## 2015-04-24 ENCOUNTER — Encounter: Payer: Self-pay | Admitting: Pharmacist

## 2015-05-31 ENCOUNTER — Ambulatory Visit: Payer: 59 | Admitting: Pharmacist

## 2015-06-29 ENCOUNTER — Ambulatory Visit (HOSPITAL_COMMUNITY): Payer: 59

## 2015-07-05 ENCOUNTER — Ambulatory Visit (HOSPITAL_COMMUNITY)
Admission: RE | Admit: 2015-07-05 | Discharge: 2015-07-05 | Disposition: A | Discharge status: Home / Self Care | Payer: 59 | Source: Ambulatory Visit | Attending: Internal Medicine | Admitting: Internal Medicine

## 2015-07-05 DIAGNOSIS — Z1231 Encounter for screening mammogram for malignant neoplasm of breast: Secondary | ICD-10-CM | POA: Insufficient documentation

## 2015-07-24 ENCOUNTER — Ambulatory Visit (INDEPENDENT_AMBULATORY_CARE_PROVIDER_SITE_OTHER): Payer: Self-pay | Admitting: Family Medicine

## 2015-07-24 ENCOUNTER — Encounter: Payer: Self-pay | Admitting: Pharmacist

## 2015-07-24 ENCOUNTER — Ambulatory Visit: Payer: 59 | Admitting: Pharmacist

## 2015-07-24 VITALS — BP 114/66 | Wt 169.0 lb

## 2015-07-24 DIAGNOSIS — E119 Type 2 diabetes mellitus without complications: Secondary | ICD-10-CM

## 2015-07-24 NOTE — Patient Instructions (Signed)
1)  Continue to increase exercise to 25-35 minutes time, great job with this! 2)  Weight loss goal of 2 lbs 3)  Continue to maintain healthy dietary changes, especially limiting portion sizes 4)  Contact benefits and choose another dentist 5)  Schedule a 3 year colonoscopy 5)  Follow-up in 3 months on Tuesday December 13th @ 11:00 am  Great to see you today!

## 2015-07-24 NOTE — Progress Notes (Signed)
Subjective:  Patient presents today for 3 month diabetes follow-up as part of the employer-sponsored Link to Wellness program.  Current diabetes regimen includes Metformin, glipizide, januvia, and pioglitazone.  Patient also continues on daily ASA, ACE Inhibitor and statin.  Most recent MD follow-up was July 2015.  Patient has a pending appt for follow-up in 6 months in December 2015.  No med changes or major health changes at this time.   Of note, patient reports she is due for 3 year colonoscopy and will call to schedule an appt soon.    Assessment/Plan:  Per pt recall, A1c was 7% at last MD appt.  Still using TrueTest, but will switch to Truemetrix upon running out of strips.   Testing twice daily, fasting and after evening meal. This AM - 121, 80-120s.   As high as 200 after eating supper.    Patient is a 58 yo female with DM type 2. Patient cannot recall most recent A1c but believes it was 7% or 7.2% with goal remaining at less than 7%.  Weight has decreased by 4 lb since last visit but patient has set goal to continue to lose weight. Patient did not bring meter today but is currently using TrueResult supplies.  This meter has been discontinued and at next refill patient will be switched to TrueMetrix.  Per patient report she is testing twice daily, fasting and after the evening meal.  She reports fasting glucose averaging 80-130.  Patient denies signs and symptoms of neuropathy.  Eye exam is up to date.  Dental exam is past due, pt is looking for a new dentist.  I have provided benefits number so she can get a listing of in-network dentists.    Lifestyle:  Physical Activity-  Uses treadmill at [facility] fitness center three times per week for 20-30 minutes each time.  Pt has met goal of exercising three times weekly (vs two) and she would like to further increase by 5 minutes each time.   Nutrition-  Portion sizes have been reduced.  Reduced portion size of rice.  Very few sweets or desserts.    Patient has made efforts to improve diet by reducing overall portion sizes.  She continues to eat rice on a regular basis but has made an effort to reduce overall portion size.  She will continue to maintain healthy dietary changes and will continue to monitor portion sizes.   Goals for Next Visit:  1)  Continue to increase exercise to 25-35 minutes time, great job with this! 2)  Weight loss goal of 2 lbs 3)  Continue to maintain healthy dietary changes, especially limiting portion sizes 4)  Contact benefits and choose another dentist 5)  Schedule a 3 year colonoscopy 5)  Follow-up in 3 months on Tuesday December 13th @ 11:00 am  Great to see you today!   Tilman Neat, PharmD Link to [facility]

## 2015-07-25 ENCOUNTER — Encounter: Payer: Self-pay | Admitting: Gastroenterology

## 2015-07-31 NOTE — Progress Notes (Signed)
Patient ID: [name], female   DOB: 02/12/1956, 59 y.o.   MRN: [id number] ATTENDING PHYSICIAN NOTE: I have reviewed the chart and agree with the plan as detailed above. Kaleisha Bhargava MD Pager [phone]  

## 2015-09-14 ENCOUNTER — Ambulatory Visit (AMBULATORY_SURGERY_CENTER): Payer: Self-pay

## 2015-09-14 VITALS — Ht 60.5 in | Wt 179.2 lb

## 2015-09-14 DIAGNOSIS — Z860100 Personal history of colon polyps, unspecified: Secondary | ICD-10-CM

## 2015-09-14 NOTE — Progress Notes (Signed)
No allergies to eggs or soy No home oxygen No diet/weight loss meds No past problems with anesthesia  Has email; has had colono before; refused emmi

## 2015-09-28 ENCOUNTER — Ambulatory Visit (AMBULATORY_SURGERY_CENTER): Payer: 59 | Admitting: Gastroenterology

## 2015-09-28 ENCOUNTER — Encounter: Payer: Self-pay | Admitting: Gastroenterology

## 2015-09-28 VITALS — BP 171/75 | HR 64 | Temp 97.0°F | Resp 21 | Ht 60.5 in | Wt 179.0 lb

## 2015-09-28 DIAGNOSIS — D120 Benign neoplasm of cecum: Secondary | ICD-10-CM

## 2015-09-28 DIAGNOSIS — D123 Benign neoplasm of transverse colon: Secondary | ICD-10-CM | POA: Diagnosis not present

## 2015-09-28 DIAGNOSIS — Z86010 Personal history of colonic polyps: Secondary | ICD-10-CM | POA: Diagnosis not present

## 2015-09-28 HISTORY — PX: COLONOSCOPY: SHX174

## 2015-09-28 LAB — GLUCOSE, CAPILLARY
GLUCOSE-CAPILLARY: 147 mg/dL — AB (ref 65–99)
Glucose-Capillary: 156 mg/dL — ABNORMAL HIGH (ref 65–99)

## 2015-09-28 MED ORDER — SODIUM CHLORIDE 0.9 % IV SOLN: 500.0000 mL | INTRAVENOUS | Status: DC

## 2015-09-28 NOTE — Progress Notes (Signed)
Report to PACU, RN, vss, BBS= Clear.  

## 2015-09-28 NOTE — Op Note (Signed)
[place]  [address] [place], [id number]   COLONOSCOPY PROCEDURE REPORT  PATIENT: Emma Davenport, Emma Davenport  MR#: [id number] BIRTHDATE: [birth year] , 59  yrs. old GENDER: female ENDOSCOPIST: Milus Banister, MD PROCEDURE DATE:  09/28/2015 PROCEDURE:   Colonoscopy, surveillance and Colonoscopy with snare polypectomy First Screening Colonoscopy - Avg.  risk and is 50 yrs.  old or older - No.  Prior Negative Screening - Now for repeat screening. N/A  History of Adenoma - Now for follow-up colonoscopy & has been > or = to 3 yrs.  Yes hx of adenoma.  Has been 3 or more years since last colonoscopy.  Polyps removed today? Yes ASA CLASS:   Class II INDICATIONS:Surveillance due to prior colonic neoplasia and Colonoscopy with Dr.  Sharlett Iles 2012 "multiple polyps removed up to 2cm." No exact number of how many, what sites however he repeated colonoscopy 1 year later and no polyps were found. MEDICATIONS: Monitored anesthesia care and Propofol 200 mg IV  DESCRIPTION OF PROCEDURE:   After the risks benefits and alternatives of the procedure were thoroughly explained, informed consent was obtained.  The digital rectal exam revealed no abnormalities of the rectum.   The LB [id number]  endoscope was introduced through the anus and advanced to the cecum, which was identified by both the appendix and ileocecal valve. No adverse events experienced.   The quality of the prep was good.  The instrument was then slowly withdrawn as the colon was fully examined. Estimated blood loss is zero unless otherwise noted in this procedure report.  COLON FINDINGS: A sessile polyp measuring 4 mm in size was found at the cecum.  A polypectomy was performed with a cold snare.  The resection was complete, the polyp tissue was completely retrieved and sent to histology.   A semi-pedunculated polyp measuring 12 mm in size was found in the transverse colon.  A polypectomy was performed using snare  cautery.  The resection was complete, the polyp tissue was completely retrieved and sent to histology.   The examination was otherwise normal.  Retroflexed views revealed no abnormalities. The time to cecum = 6.2 Withdrawal time = 11.1   The scope was withdrawn and the procedure completed. COMPLICATIONS: There were no immediate complications.  ENDOSCOPIC IMPRESSION: 1.   Sessile polyp was found at the cecum; polypectomy was performed with a cold snare 2.   Semi-pedunculated polyp was found in the transverse colon; polypectomy was performed using snare cautery 3.   The examination was otherwise normal  RECOMMENDATIONS: If the polyp(s) removed today are proven to be adenomatous (pre-cancerous) polyps, you will need a colonoscopy in 3 years. You will receive a letter within 1-2 weeks with the results of your biopsy as well as final recommendations.  Please call my office if you have not received a letter after 3 weeks.  eSigned:  Milus Banister, MD 09/28/2015 9:37 AM   cc: Seward Carol, MD

## 2015-09-28 NOTE — Patient Instructions (Signed)
YOU HAD AN ENDOSCOPIC PROCEDURE TODAY AT THE [facility]:   Refer to the procedure report that was given to you for any specific questions about what was found during the examination.  If the procedure report does not answer your questions, please call your gastroenterologist to clarify.  If you requested that your care partner not be given the details of your procedure findings, then the procedure report has been included in a sealed envelope for you to review at your convenience later.  YOU SHOULD EXPECT: Some feelings of bloating in the abdomen. Passage of more gas than usual.  Walking can help get rid of the air that was put into your GI tract during the procedure and reduce the bloating. If you had a lower endoscopy (such as a colonoscopy or flexible sigmoidoscopy) you may notice spotting of blood in your stool or on the toilet paper. If you underwent a bowel prep for your procedure, you may not have a normal bowel movement for a few days.  Please Note:  You might notice some irritation and congestion in your nose or some drainage.  This is from the oxygen used during your procedure.  There is no need for concern and it should clear up in a day or so.  SYMPTOMS TO REPORT IMMEDIATELY:   Following lower endoscopy (colonoscopy or flexible sigmoidoscopy):  Excessive amounts of blood in the stool  Significant tenderness or worsening of abdominal pains  Swelling of the abdomen that is new, acute  Fever of 100F or higher  For urgent or emergent issues, a gastroenterologist can be reached at any hour by calling [phone].   DIET: Your first meal following the procedure should be a small meal and then it is ok to progress to your normal diet. Heavy or fried foods are harder to digest and may make you feel nauseous or bloated.  Likewise, meals heavy in dairy and vegetables can increase bloating.  Drink plenty of fluids but you should avoid alcoholic beverages for 24  hours.  ACTIVITY:  You should plan to take it easy for the rest of today and you should NOT DRIVE or use heavy machinery until tomorrow (because of the sedation medicines used during the test).    FOLLOW UP: Our staff will call the number listed on your records the next business day following your procedure to check on you and address any questions or concerns that you may have regarding the information given to you following your procedure. If we do not reach you, we will leave a message.  However, if you are feeling well and you are not experiencing any problems, there is no need to return our call.  We will assume that you have returned to your regular daily activities without incident.  If any biopsies were taken you will be contacted by phone or by letter within the next 1-3 weeks.  Please call us at [phone] if you have not heard about the biopsies in 3 weeks.    SIGNATURES/CONFIDENTIALITY: You and/or your care partner have signed paperwork which will be entered into your electronic medical record.  These signatures attest to the fact that that the information above on your After Visit Summary has been reviewed and is understood.  Full responsibility of the confidentiality of this discharge information lies with you and/or your care-partner.  Please review polyp handout provided. Next colonoscopy determined by pathology results.  

## 2015-09-28 NOTE — Progress Notes (Signed)
Called to room to assist during endoscopic procedure.  Patient ID and intended procedure confirmed with present staff. Received instructions for my participation in the procedure from the performing physician.  

## 2015-10-01 ENCOUNTER — Telehealth: Payer: Self-pay | Admitting: *Deleted

## 2015-10-01 NOTE — Telephone Encounter (Signed)
Message left

## 2015-10-04 ENCOUNTER — Encounter: Payer: Self-pay | Admitting: Gastroenterology

## 2015-10-30 ENCOUNTER — Ambulatory Visit (INDEPENDENT_AMBULATORY_CARE_PROVIDER_SITE_OTHER): Payer: Self-pay | Admitting: Family Medicine

## 2015-10-30 ENCOUNTER — Encounter: Payer: Self-pay | Admitting: Pharmacist

## 2015-10-30 VITALS — BP 110/58 | Wt 174.0 lb

## 2015-10-30 DIAGNOSIS — E119 Type 2 diabetes mellitus without complications: Secondary | ICD-10-CM

## 2015-10-30 NOTE — Patient Instructions (Signed)
1)  Continue to increase exercise at least twice per week.  Attempt to add one day of exercise on weekend 2)  Weight loss goal of 5 lbs 3)  Continue to maintain healthy dietary changes, especially limiting portion sizes of starches (rice, bread, potato,etc) 4)  Attempt to schedule appt with new dentist 5)  Attempt to improve taking atorvastatin every day 5)  Follow-up in 3 months on Tuesday Feb 21st @ 11:00 am   Great to see you today!

## 2015-10-30 NOTE — Progress Notes (Signed)
  Subjective:  Patient is a 59 yo female with type 2 diabetes who presents today for 3 month follow-up as part of the employer-sponsored [facility] program. Current diabetes regimen includes glipizide, metformin, pioglitazone, and januvia. Patient also continues on daily ASA, ACEi, and statin, but is somewhat non-compliant.  Most recent MD follow-up was Dec 2016. Patient has a pending appt for 4 mo follow-up in April 2017.  No med changes or major health changes at this time.  Of note, patient complains of occasional nocturnal muscle cramping.  She has made MD aware.  Also, patient recently underwent colonoscopy and, per pt report, was asked to repeat in 3 years.   Diabetes Assessment:  No changes to diabetes regimen at this time. Patient does not maintain good medication compliance but we have discussed improving this.  Most recent A1c was drawn Dec 2017, but not yet resulted.  Previous A1c was 7.0%.  Weight has increased by 5 lbs since last visit.  Patient did not bring meter today but is currently testing 2 times per day. Glucose monitoring occurs fasting and after supper.  Hypoglycemia is occasional.  Patient can identify symptoms and does demonstrate appropriate correction when needed.  Patient denies signs and symptoms of neuropathy including numbness/tingling/burning and symptoms of foot infection.  Patient is up to date on eye exam and will be due again in Feb 2017.  She is looking for a new dentist and is overdue for a dental appt.      Per pt report, glucose is running: Fasting - 114, 85, highest in AM 120.  Night readings are higher, highest 200 due to diet.  Most of time 140-170.   Lifestyle Assessment:  Diet - Patient is attempting to maintain healthy diet.  We have discussed limiting starchy carbs with evening meal.  See below for food recall:  Breakfast - coffee with sugar and cream, 1 biscuit, [place] sausage Lunch - soup, chicken noodle, cafeteria, crackers Dinner -  cooks for herself, chicken, rice, or mashed potatoes, green beans or salad Beverages - water Snacks - none Sweets/dessert - orange, grapes, fruit   Exercise- Continues using treadmill at [facility] fitness center twice per week for 20-30 minutes each time.  Patient would like to lose weight (5 lb) so we have set a goal to increase use of treadmill to three times weekly.  Patient feels she can add one weekend day per week in addition to the two weekdays for a total of 3 days per week.    Plan and Goals:  1)  Continue to increase exercise at least twice per week.  Attempt to add one day of exercise on weekend 2)  Weight loss goal of 5 lbs 3)  Continue to maintain healthy dietary changes, especially limiting portion sizes of starches (rice, bread, potato,etc) 4)  Attempt to schedule appt with new dentist 5)  Attempt to improve taking atorvastatin every day 6)  Follow-up in 3 months on Tuesday Feb 21st @ 11:00 am  Great to see you today!  Tilman Neat, PharmD [facility]  [phone]

## 2015-11-13 NOTE — Addendum Note (Signed)
Addended by: Steva Ready on: 11/13/2015 02:52 PM   Modules accepted: Level of Service

## 2016-01-08 ENCOUNTER — Ambulatory Visit (INDEPENDENT_AMBULATORY_CARE_PROVIDER_SITE_OTHER): Payer: Self-pay | Admitting: Family Medicine

## 2016-01-08 ENCOUNTER — Encounter: Payer: Self-pay | Admitting: Pharmacist

## 2016-01-08 VITALS — BP 108/56 | Wt 170.0 lb

## 2016-01-08 DIAGNOSIS — E119 Type 2 diabetes mellitus without complications: Secondary | ICD-10-CM

## 2016-01-08 MED FILL — metFORMIN HCL 1000 MG TABS: 1000 | 90 days supply | Qty: 180 | Fill #2

## 2016-01-08 MED FILL — PIOGLITAZONE HCL 30 MG TAB: 30 | 90 days supply | Qty: 90 | Fill #1

## 2016-01-08 MED FILL — LISINOPRIL-HCTZ 20-25 MG TA: 20-25 | 90 days supply | Qty: 90 | Fill #2

## 2016-01-08 MED FILL — ATORVASTATIN 20 MG TABLET: 20 | 90 days supply | Qty: 90 | Fill #2

## 2016-01-08 MED FILL — JANUVIA 100 MG TABLET: 100 | 90 days supply | Qty: 90 | Fill #3

## 2016-01-08 MED FILL — glipiZIDE 10 MG TABS: 10 | 90 days supply | Qty: 180 | Fill #2

## 2016-01-08 MED FILL — TRUEplus LANCETS 30G MISC: 90 days supply | Qty: 200 | Fill #0

## 2016-01-08 MED FILL — TRUE METRIX GLUCOSE TEST ST: 90 days supply | Qty: 200 | Fill #0

## 2016-01-08 NOTE — Patient Instructions (Signed)
1)  Continue exercising at least twice per week 2)  Great job with weight loss of 4 lbs 3)  Continue to maintain healthy dietary changes, especially limiting portion sizes of starches (rice, bread, potato,etc) 4)  Schedule appt with new dentist 5)  Schedule a yearly eye exam 5)  Attempt to improve taking atorvastatin every day 6)  Follow-up on Tuesday June 13th @ 11:00 am  Great to see you today!

## 2016-01-08 NOTE — Progress Notes (Signed)
Subjective:  Patient is a 60 yo female with type 2 diabetes who presents today for 3 month follow-up as part of the employer-sponsored [facility] program. Current diabetes regimen includes glipizide, metformin, pioglitazone, and januvia. Patient also continues on daily ASA, ACEi, and statin, but is somewhat non-compliant, specifically with statin.  Most recent MD follow-up was Nov 2016. Patient has a pending appt for 4 mo follow-up in April 2017.  No med changes or major health changes at this time.   Diabetes Assessment:   No changes to diabetes regimen at this time. Patient does not maintain good medication compliance but we have discussed improving this.  Most recent A1c was drawn Dec 2016, but patient unaware of result.  Previous A1c was 7.0%.  Weight has decreased by 4 lbs since last visit.  Patient did not bring meter today but is currently testing 2-3 times per day. Glucose monitoring occurs fasting and after lunch and sometimes supper.  Hypoglycemia is occasional, and as low as 50s.  Patient can identify symptoms and does demonstrate appropriate correction when needed.  Patient denies signs and symptoms of neuropathy including numbness/tingling/burning and symptoms of foot infection.  Patient is due for eye exam and will schedule an appt soon.  She is looking for a new dentist and is overdue for a dental appt.       Lifestyle Assessment:  Diet - Patient is attempting to maintain healthy diet and has made some improvements recently resulting in a 4 lb weight loss.  We have discussed limiting starchy carbs with evening meal.  See below for food recall:  Stopped drinking coffee with sugar and creamer Now drinks hot tea with lemon juice no added sugar Breakfast - pancake at work [employer] - lack of time, small lunch because no time Holiday representative - cooking at home, chicken, rice, one vegetable usually green beans or salad Snacks - none usually  Exercise- Continues using treadmill at [facility] fitness  center twice per week on weekends for 20-30 minutes each time.  She does not feel she is able to add an additional day at this time.    Plan and Goals:  1)  Continue exercising at least twice per week 2)  Great job with weight loss of 4 lbs 3)  Continue to maintain healthy dietary changes, especially limiting portion sizes of starches (rice, bread, potato,etc) 4)  Schedule appt with new dentist 5)  Schedule a yearly eye exam 5)  Attempt to improve taking atorvastatin every day 6)  Follow-up on Tuesday June 13th @ 11:00 am  Great to see you today!  Tilman Neat, PharmD [facility]  [phone]

## 2016-01-27 NOTE — Progress Notes (Signed)
I have reviewed this pharmacist's note and agree  

## 2016-03-03 DIAGNOSIS — E7800 Pure hypercholesterolemia, unspecified: Secondary | ICD-10-CM | POA: Diagnosis not present

## 2016-03-03 DIAGNOSIS — E663 Overweight: Secondary | ICD-10-CM | POA: Diagnosis not present

## 2016-03-03 DIAGNOSIS — I10 Essential (primary) hypertension: Secondary | ICD-10-CM | POA: Diagnosis not present

## 2016-03-03 DIAGNOSIS — Z6833 Body mass index (BMI) 33.0-33.9, adult: Secondary | ICD-10-CM | POA: Diagnosis not present

## 2016-03-03 DIAGNOSIS — E1165 Type 2 diabetes mellitus with hyperglycemia: Secondary | ICD-10-CM | POA: Diagnosis not present

## 2016-03-03 DIAGNOSIS — D649 Anemia, unspecified: Secondary | ICD-10-CM | POA: Diagnosis not present

## 2016-03-03 DIAGNOSIS — M5441 Lumbago with sciatica, right side: Secondary | ICD-10-CM | POA: Diagnosis not present

## 2016-03-03 DIAGNOSIS — Z7984 Long term (current) use of oral hypoglycemic drugs: Secondary | ICD-10-CM | POA: Diagnosis not present

## 2016-03-03 MED FILL — predniSONE 5 MG (21) TBPK: 5 | 6 days supply | Qty: 21 | Fill #0

## 2016-03-06 MED FILL — HYDROCODON-APAP 5-325: 5-325 | 10 days supply | Qty: 20 | Fill #0

## 2016-03-27 DIAGNOSIS — H2513 Age-related nuclear cataract, bilateral: Secondary | ICD-10-CM | POA: Diagnosis not present

## 2016-03-27 DIAGNOSIS — E10319 Type 1 diabetes mellitus with unspecified diabetic retinopathy without macular edema: Secondary | ICD-10-CM | POA: Diagnosis not present

## 2016-03-27 DIAGNOSIS — H26492 Other secondary cataract, left eye: Secondary | ICD-10-CM | POA: Diagnosis not present

## 2016-03-27 DIAGNOSIS — H11153 Pinguecula, bilateral: Secondary | ICD-10-CM | POA: Diagnosis not present

## 2016-03-27 DIAGNOSIS — Z961 Presence of intraocular lens: Secondary | ICD-10-CM | POA: Diagnosis not present

## 2016-03-27 DIAGNOSIS — H5201 Hypermetropia, right eye: Secondary | ICD-10-CM | POA: Diagnosis not present

## 2016-03-27 DIAGNOSIS — H52221 Regular astigmatism, right eye: Secondary | ICD-10-CM | POA: Diagnosis not present

## 2016-03-27 DIAGNOSIS — H11423 Conjunctival edema, bilateral: Secondary | ICD-10-CM | POA: Diagnosis not present

## 2016-03-27 DIAGNOSIS — H18413 Arcus senilis, bilateral: Secondary | ICD-10-CM | POA: Diagnosis not present

## 2016-04-15 DIAGNOSIS — H40031 Anatomical narrow angle, right eye: Secondary | ICD-10-CM | POA: Diagnosis not present

## 2016-04-15 DIAGNOSIS — H1851 Endothelial corneal dystrophy: Secondary | ICD-10-CM | POA: Diagnosis not present

## 2016-04-15 DIAGNOSIS — H26492 Other secondary cataract, left eye: Secondary | ICD-10-CM | POA: Diagnosis not present

## 2016-04-15 DIAGNOSIS — H2511 Age-related nuclear cataract, right eye: Secondary | ICD-10-CM | POA: Diagnosis not present

## 2016-04-15 DIAGNOSIS — H35372 Puckering of macula, left eye: Secondary | ICD-10-CM | POA: Diagnosis not present

## 2016-04-15 DIAGNOSIS — Z961 Presence of intraocular lens: Secondary | ICD-10-CM | POA: Diagnosis not present

## 2016-04-15 DIAGNOSIS — E113553 Type 2 diabetes mellitus with stable proliferative diabetic retinopathy, bilateral: Secondary | ICD-10-CM | POA: Diagnosis not present

## 2016-04-17 MED FILL — JANUVIA 100 MG TABLET: 100 | 90 days supply | Qty: 90 | Fill #0

## 2016-04-17 MED FILL — PIOGLITAZONE HCL 30 MG TAB: 30 | 90 days supply | Qty: 90 | Fill #2

## 2016-04-17 MED FILL — ATORVASTATIN 20 MG TABLET: 20 | 90 days supply | Qty: 90 | Fill #0

## 2016-04-17 MED FILL — glipiZIDE 10 MG TABS: 10 | 90 days supply | Qty: 180 | Fill #3

## 2016-04-17 MED FILL — metFORMIN HCL 1000 MG TABS: 1000 | 90 days supply | Qty: 180 | Fill #3

## 2016-04-21 MED FILL — LISINOPRIL-HCTZ 20-25 MG TA: 20-25 | 90 days supply | Qty: 90 | Fill #3

## 2016-04-25 DIAGNOSIS — H26492 Other secondary cataract, left eye: Secondary | ICD-10-CM | POA: Diagnosis not present

## 2016-04-28 ENCOUNTER — Encounter (INDEPENDENT_AMBULATORY_CARE_PROVIDER_SITE_OTHER): Payer: 59 | Admitting: Ophthalmology

## 2016-04-29 ENCOUNTER — Other Ambulatory Visit: Payer: Self-pay | Admitting: Pharmacist

## 2016-04-29 ENCOUNTER — Encounter: Payer: Self-pay | Admitting: Pharmacist

## 2016-04-29 VITALS — BP 124/58 | Wt 172.0 lb

## 2016-04-29 DIAGNOSIS — E119 Type 2 diabetes mellitus without complications: Secondary | ICD-10-CM

## 2016-04-29 NOTE — Patient Outreach (Signed)
[facility]) Care Management  04/29/2016  Emma Davenport 1955-12-04 [id number]   Subjective:  Patient is a 60 yo female with type 2 diabetes who presents today for 3 month follow-up as part of the employer-sponsored [facility] program. Current diabetes regimen includes Metformin, glipizide, pioglitazone, and januvia. Patient also continues on daily ASA, ACEi, and statin. Most recent MD follow-up was April 2017 with PCP, Dr. Delfina Redwood. Patient has a pending appt for 4 mo follow-up in Aug 2017. No med changes or major health changes at this time.  Diabetes Assessment:  No changes to diabetes regimen. Patient has improved medication compliance. Most recent A1c was 8.0% at last PCP visit per patient report which is increased and exceeding goal of less than 7%.  This may be due in part to recent prednisone course as well as recent steroid injections for leg pain.  Weight has increased by 2 lb since last visit.  Patient did not bring meter today but is currently testing up to 5 times per week. Glucose monitoring occurs when patient remembers or when symptomatic.  Hypoglycemia is occassional. Recent episode as low as 44.  Patient does not demonstrate appropriate correction of hypoglycemia and corrected with only 1 glucose tablet.  I have provided education and handout regarding appropriate hypoglycemia correction.  Highest glucose reading of 225 due to over-eating.  Patient denies signs and symptoms of neuropathy including numbness/tingling/burning and symptoms of foot infection.  Patient is up to date on eye and dental exam.      Lifestyle Assessment:  Diet - diet has remained unchanged.  See food recall for details.  We have reviewed the importance of limiting starchy carbs since this patient's diet is high in rice.  Patient continues cooking at home and attempts to manage serving sizes.  She does not snack often.    Exercise - No change, patient continues exercising twice weekly  for 15-30 minutes at [facility] fitness center.   Plan and Goals: 1)  Maintain healthy dietary changes.  Continue to limit starchy foods and better monitor serving sizes of starches 2)  Attempt to exercise for at least 30 minutes twice weekly (vs 15 min) 3)  Good job with improved medication compliance.   4)  Keep appointment with Dr. Delfina Redwood in August for repeat A1c (with goal of reduced A1c) 5)  Follow-up with [facility] on Tuesday Sept 12, 2017 @ 11:00 am  [place] to see you today!

## 2016-07-29 ENCOUNTER — Ambulatory Visit: Payer: Self-pay | Admitting: Pharmacist

## 2016-07-31 DIAGNOSIS — E7800 Pure hypercholesterolemia, unspecified: Secondary | ICD-10-CM | POA: Diagnosis not present

## 2016-07-31 DIAGNOSIS — Z6834 Body mass index (BMI) 34.0-34.9, adult: Secondary | ICD-10-CM | POA: Diagnosis not present

## 2016-07-31 DIAGNOSIS — Z7984 Long term (current) use of oral hypoglycemic drugs: Secondary | ICD-10-CM | POA: Diagnosis not present

## 2016-07-31 DIAGNOSIS — I10 Essential (primary) hypertension: Secondary | ICD-10-CM | POA: Diagnosis not present

## 2016-07-31 DIAGNOSIS — E08319 Diabetes mellitus due to underlying condition with unspecified diabetic retinopathy without macular edema: Secondary | ICD-10-CM | POA: Diagnosis not present

## 2016-07-31 DIAGNOSIS — E663 Overweight: Secondary | ICD-10-CM | POA: Diagnosis not present

## 2016-07-31 DIAGNOSIS — E1165 Type 2 diabetes mellitus with hyperglycemia: Secondary | ICD-10-CM | POA: Diagnosis not present

## 2016-08-04 MED FILL — JANUVIA 100 MG TABLET: 100 | 90 days supply | Qty: 90 | Fill #1

## 2016-08-04 MED FILL — PIOGLITAZONE HCL 30 MG TAB: 30 | 90 days supply | Qty: 90 | Fill #3

## 2016-08-04 MED FILL — ATORVASTATIN 20 MG TABLET: 20 | 90 days supply | Qty: 90 | Fill #1

## 2016-08-05 MED FILL — glipiZIDE 10 MG TABS: 10 | 90 days supply | Qty: 180 | Fill #0

## 2016-08-05 MED FILL — metFORMIN HCL 1000 MG TABS: 1000 | 90 days supply | Qty: 180 | Fill #0

## 2016-08-07 MED FILL — LISINOPRIL-HCTZ 20-25 MG TA: 20-25 | 90 days supply | Qty: 90 | Fill #0

## 2016-08-08 MED FILL — BETAMETHASONE DP 0.05% CRM: 0.05 | 14 days supply | Qty: 45 | Fill #0

## 2016-08-14 ENCOUNTER — Other Ambulatory Visit: Payer: Self-pay | Admitting: Pharmacist

## 2016-08-14 ENCOUNTER — Encounter: Payer: Self-pay | Admitting: Pharmacist

## 2016-08-14 VITALS — BP 110/54 | Wt 176.0 lb

## 2016-08-14 DIAGNOSIS — E119 Type 2 diabetes mellitus without complications: Secondary | ICD-10-CM

## 2016-08-14 NOTE — Patient Outreach (Signed)
[facility]) Care Management  08/14/2016  Shatika Paprocki 1956/06/23 [id number]   Subjective:  Patient is a 60 yo female with type 2 diabetes who presents today for 3 month follow-up as part of the employer-sponsored [facility] program. Current diabetes regimen includes Metformin, glipizide, pioglitazone, and januvia. Patient also continues on daily ASA, ACEi, and statin. Most recent MD follow-up was June 2017 with PCP, Dr. Delfina Redwood. Patient has a pending appt for follow-up in Oct 2017. No med changes or major health changes at this time.    Diabetes Assessment:  No changes to diabetes regimen. Patient remains compliant with medications.  Labs were done 2 weeks ago at PCP office, but pt has not been made aware of results.  I am hopeful A1c has improved, as most recent one was 8.0%.  Weight has increased by 4 lb since last visit.  Patient did bring meter today and is currently testing 1-2 times per day. Glucose monitoring occurs fasting, in the evenings (randomly), and when symptomatic.  Hypoglycemia is occassional as low as 64.  Patient does not demonstrate appropriate correction of hypoglycemia and corrected with only 1 glucose tablet.  I have provided education and handout regarding appropriate hypoglycemia correction.  Highest glucose readings of >200 for unknown reason, pt believes it may be due to dietary noncompliance on those days.  Patient denies signs and symptoms of neuropathy including numbness/tingling/burning and symptoms of foot infection.  Patient is up to date on eye and dental exam.      Eye exam - June 2017, Dec follow-up Dental exam - Sept 2017  Testing glucose twice daily, AM and PM.   7d avg - 143 14d avg - 136 30d avg -  136   Lifestyle Assessment:  Diet - Pt has made small improvement in diet.  We have reviewed the importance of limiting starchy carbs since this patient's diet is high in rice.  She does report switching to brown rice (vs white  or jasmine rice) in an effort to improve fiber.   Patient continues cooking at home and attempts to manage serving sizes.  She does not snack often and is now avoiding soda and all fruit juices.   Exercise - Patient has improved in this area, she continues exercising twice weekly at [facility] fitness center, but has increased to 30 minutes, consistently.    Plan and Goals: 1)  Maintain healthy dietary changes.  Continue to choose brown rice and avoid soda and fruit juices 2)  Continue to exercise for at least 30 minutes twice weekly  3)  Maintain medication compliance.   4)  Contact Dr. Lina Sar office in August for A1c results 5)  Follow-up with [facility] on Thursday January 4th @ 2:30 pm  Great to see you today,   Tilman Neat, PharmD [facility] [place]  [phone]

## 2016-08-27 DIAGNOSIS — Z124 Encounter for screening for malignant neoplasm of cervix: Secondary | ICD-10-CM | POA: Diagnosis not present

## 2016-08-27 DIAGNOSIS — Z1231 Encounter for screening mammogram for malignant neoplasm of breast: Secondary | ICD-10-CM | POA: Diagnosis not present

## 2016-08-27 DIAGNOSIS — Z6839 Body mass index (BMI) 39.0-39.9, adult: Secondary | ICD-10-CM | POA: Diagnosis not present

## 2016-08-27 DIAGNOSIS — Z01419 Encounter for gynecological examination (general) (routine) without abnormal findings: Secondary | ICD-10-CM | POA: Diagnosis not present

## 2016-08-27 DIAGNOSIS — Z1272 Encounter for screening for malignant neoplasm of vagina: Secondary | ICD-10-CM | POA: Diagnosis not present

## 2016-08-27 DIAGNOSIS — R87610 Atypical squamous cells of undetermined significance on cytologic smear of cervix (ASC-US): Secondary | ICD-10-CM | POA: Diagnosis not present

## 2016-11-06 DIAGNOSIS — H11022 Central pterygium of left eye: Secondary | ICD-10-CM | POA: Diagnosis not present

## 2016-11-06 DIAGNOSIS — H2511 Age-related nuclear cataract, right eye: Secondary | ICD-10-CM | POA: Diagnosis not present

## 2016-11-06 DIAGNOSIS — H40021 Open angle with borderline findings, high risk, right eye: Secondary | ICD-10-CM | POA: Diagnosis not present

## 2016-11-06 DIAGNOSIS — H5201 Hypermetropia, right eye: Secondary | ICD-10-CM | POA: Diagnosis not present

## 2016-11-06 DIAGNOSIS — H40031 Anatomical narrow angle, right eye: Secondary | ICD-10-CM | POA: Diagnosis not present

## 2016-11-06 DIAGNOSIS — H25011 Cortical age-related cataract, right eye: Secondary | ICD-10-CM | POA: Diagnosis not present

## 2016-11-06 DIAGNOSIS — H40013 Open angle with borderline findings, low risk, bilateral: Secondary | ICD-10-CM | POA: Diagnosis not present

## 2016-11-06 DIAGNOSIS — H52221 Regular astigmatism, right eye: Secondary | ICD-10-CM | POA: Diagnosis not present

## 2016-11-06 DIAGNOSIS — H04123 Dry eye syndrome of bilateral lacrimal glands: Secondary | ICD-10-CM | POA: Diagnosis not present

## 2016-11-07 MED FILL — XIIDRA 5% EYE DROPS: 5 | 30 days supply | Qty: 60 | Fill #0

## 2016-11-20 ENCOUNTER — Ambulatory Visit: Payer: 59 | Admitting: Pharmacist

## 2016-11-24 MED FILL — JANUVIA 100 MG TABLET: 100 | 90 days supply | Qty: 90 | Fill #2

## 2016-11-24 MED FILL — ATORVASTATIN 20 MG TABLET: 20 | 90 days supply | Qty: 90 | Fill #2

## 2016-11-24 MED FILL — glipiZIDE 10 MG TABS: 10 | 90 days supply | Qty: 180 | Fill #1

## 2016-11-25 MED FILL — PIOGLITAZONE HCL 30 MG TAB: 30 | 90 days supply | Qty: 90 | Fill #0

## 2016-12-02 MED FILL — FREESTYLE LITE METER: 30 days supply | Qty: 1 | Fill #0

## 2016-12-02 MED FILL — FREESTYLE LITE TEST STRIP: 90 days supply | Qty: 100 | Fill #0

## 2016-12-02 MED FILL — FREESTYLE LANCETS: 90 days supply | Qty: 100 | Fill #0

## 2016-12-05 MED FILL — LISINOPRIL-HCTZ 20-25 MG TA: 20-25 | 90 days supply | Qty: 90 | Fill #0

## 2016-12-09 DIAGNOSIS — H2511 Age-related nuclear cataract, right eye: Secondary | ICD-10-CM | POA: Diagnosis not present

## 2016-12-09 DIAGNOSIS — H35372 Puckering of macula, left eye: Secondary | ICD-10-CM | POA: Diagnosis not present

## 2016-12-09 DIAGNOSIS — H26492 Other secondary cataract, left eye: Secondary | ICD-10-CM | POA: Diagnosis not present

## 2016-12-09 DIAGNOSIS — H1851 Endothelial corneal dystrophy: Secondary | ICD-10-CM | POA: Diagnosis not present

## 2016-12-09 DIAGNOSIS — Z961 Presence of intraocular lens: Secondary | ICD-10-CM | POA: Diagnosis not present

## 2016-12-09 DIAGNOSIS — H40031 Anatomical narrow angle, right eye: Secondary | ICD-10-CM | POA: Diagnosis not present

## 2017-02-12 DIAGNOSIS — Z7984 Long term (current) use of oral hypoglycemic drugs: Secondary | ICD-10-CM | POA: Diagnosis not present

## 2017-02-12 DIAGNOSIS — E7800 Pure hypercholesterolemia, unspecified: Secondary | ICD-10-CM | POA: Diagnosis not present

## 2017-02-12 DIAGNOSIS — E663 Overweight: Secondary | ICD-10-CM | POA: Diagnosis not present

## 2017-02-12 DIAGNOSIS — Z6835 Body mass index (BMI) 35.0-35.9, adult: Secondary | ICD-10-CM | POA: Diagnosis not present

## 2017-02-12 DIAGNOSIS — R21 Rash and other nonspecific skin eruption: Secondary | ICD-10-CM | POA: Diagnosis not present

## 2017-02-12 DIAGNOSIS — E1122 Type 2 diabetes mellitus with diabetic chronic kidney disease: Secondary | ICD-10-CM | POA: Diagnosis not present

## 2017-02-12 DIAGNOSIS — I10 Essential (primary) hypertension: Secondary | ICD-10-CM | POA: Diagnosis not present

## 2017-02-17 MED FILL — PIOGLITAZONE HCL 45 MG TAB: 45 | 90 days supply | Qty: 90 | Fill #0

## 2017-02-24 ENCOUNTER — Ambulatory Visit (INDEPENDENT_AMBULATORY_CARE_PROVIDER_SITE_OTHER): Payer: Self-pay

## 2017-02-24 ENCOUNTER — Encounter (INDEPENDENT_AMBULATORY_CARE_PROVIDER_SITE_OTHER): Payer: Self-pay | Admitting: Orthopedic Surgery

## 2017-02-24 ENCOUNTER — Ambulatory Visit (INDEPENDENT_AMBULATORY_CARE_PROVIDER_SITE_OTHER): Payer: 59

## 2017-02-24 ENCOUNTER — Ambulatory Visit (INDEPENDENT_AMBULATORY_CARE_PROVIDER_SITE_OTHER): Payer: 59 | Admitting: Orthopedic Surgery

## 2017-02-24 VITALS — Ht 60.5 in | Wt 176.0 lb

## 2017-02-24 DIAGNOSIS — M545 Low back pain, unspecified: Secondary | ICD-10-CM | POA: Insufficient documentation

## 2017-02-24 DIAGNOSIS — M549 Dorsalgia, unspecified: Secondary | ICD-10-CM | POA: Diagnosis not present

## 2017-02-24 MED ORDER — PREDNISONE 10 MG PO TABS: 10.0000 mg | ORAL_TABLET | Freq: Every day | ORAL | 0 refills | Status: DC

## 2017-02-24 MED FILL — predniSONE 10 MG TABS: 10 | 30 days supply | Qty: 30 | Fill #0
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
## 2017-02-26 DIAGNOSIS — L259 Unspecified contact dermatitis, unspecified cause: Secondary | ICD-10-CM | POA: Diagnosis not present

## 2017-02-26 MED FILL — FLUOCINONIDE 0.05% CREAM: 0.05 | 30 days supply | Qty: 60 | Fill #0

## 2017-03-12 ENCOUNTER — Telehealth: Payer: Self-pay

## 2017-03-12 NOTE — Patient Outreach (Signed)
Called Emma Davenport to schedule a follow up [facility] appointment. There was no answer left message for her to call me back.

## 2017-03-17 ENCOUNTER — Ambulatory Visit (INDEPENDENT_AMBULATORY_CARE_PROVIDER_SITE_OTHER): Payer: 59 | Admitting: Orthopedic Surgery

## 2017-03-17 ENCOUNTER — Encounter (INDEPENDENT_AMBULATORY_CARE_PROVIDER_SITE_OTHER): Payer: Self-pay | Admitting: Orthopedic Surgery

## 2017-03-17 VITALS — Ht 60.5 in | Wt 176.0 lb

## 2017-03-17 DIAGNOSIS — M545 Low back pain, unspecified: Secondary | ICD-10-CM

## 2017-03-17 DIAGNOSIS — M5442 Lumbago with sciatica, left side: Secondary | ICD-10-CM | POA: Diagnosis not present
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
## 2017-03-27 MED FILL — ATORVASTATIN 20 MG TABLET: 20 | 90 days supply | Qty: 90 | Fill #3

## 2017-03-27 MED FILL — LISINOPRIL-HCTZ 20-25 MG TA: 20-25 | 90 days supply | Qty: 90 | Fill #1

## 2017-03-27 MED FILL — glipiZIDE 10 MG TABS: 10 | 90 days supply | Qty: 180 | Fill #2

## 2017-03-27 MED FILL — metFORMIN HCL 1000 MG TABS: 1000 | 90 days supply | Qty: 180 | Fill #1

## 2017-03-27 MED FILL — JANUVIA 100 MG TABLET: 100 | 90 days supply | Qty: 90 | Fill #3

## 2017-04-06 ENCOUNTER — Ambulatory Visit: Payer: 59 | Attending: Family

## 2017-04-06 DIAGNOSIS — M25652 Stiffness of left hip, not elsewhere classified: Secondary | ICD-10-CM | POA: Insufficient documentation

## 2017-04-06 DIAGNOSIS — M25651 Stiffness of right hip, not elsewhere classified: Secondary | ICD-10-CM | POA: Insufficient documentation

## 2017-04-06 DIAGNOSIS — M545 Low back pain, unspecified: Secondary | ICD-10-CM

## 2017-04-06 DIAGNOSIS — R293 Abnormal posture: Secondary | ICD-10-CM | POA: Diagnosis not present

## 2017-04-06 DIAGNOSIS — M6281 Muscle weakness (generalized): Secondary | ICD-10-CM | POA: Diagnosis not present

## 2017-04-06 DIAGNOSIS — G8929 Other chronic pain: Secondary | ICD-10-CM | POA: Diagnosis not present

## 2017-04-06 DIAGNOSIS — M62830 Muscle spasm of back: Secondary | ICD-10-CM | POA: Diagnosis not present

## 2017-04-06 DIAGNOSIS — M2560 Stiffness of unspecified joint, not elsewhere classified: Secondary | ICD-10-CM | POA: Diagnosis not present

## 2017-04-06 NOTE — Therapy (Signed)
[place], [place], [address] Phone: [phone]   Fax:  [phone]  Physical Therapy Evaluation  Patient Details  Name: Emma Davenport MRN: [id number] Date of Birth: July 06, 1956 No Data Recorded  Encounter Date: 04/06/2017      PT End of Session - 04/06/17 1657    Visit Number 1   Number of Visits 3   Date for PT Re-Evaluation 04/24/17   Authorization Type UMR   PT Start Time 1660  pt late due to FOTO    PT Stop Time 0345   PT Time Calculation (min) 30 min   Activity Tolerance Patient tolerated treatment well;No increased pain   Behavior During Therapy WFL for tasks assessed/performed      Past Medical History:  Diagnosis Date  . Adenomatous polyp of colon 2005  . Allergy    allergic rhinitis  . Arthritis   . Diabetes mellitus 2002   T2DM  . Hyperlipidemia   . Hypertension   . Menorrhagia     Past Surgical History:  Procedure Laterality Date  . ABDOMINAL HYSTERECTOMY    . COLONOSCOPY    . CYSTOSCOPY    . POLYPECTOMY      There were no vitals filed for this visit.       Subjective Assessment - 04/06/17 1517    Subjective She reports LBP.   No injury.   She has had LBP in past. She reports chronic back pain   Patient is accompained by: --   Limitations Lifting;House hold activities  bending work tasks   How long can you sit comfortably? As needed   How long can you stand comfortably? 30 min    How long can you walk comfortably? 30 min   Diagnostic tests xray: OA   Patient Stated Goals She wants to be healed.        Currently in Pain? No/denies  sitting    Pain Score --  she was not able to label pain level    Pain Location Back   Pain Orientation Posterior;Lower   Pain Descriptors / Indicators --  unable to describe   Pain Type Chronic pain   Pain Onset More than a month ago   Pain Frequency Intermittent   Aggravating Factors  Being on feet   Pain Relieving Factors sit  , meds   Multiple Pain Sites No            [facility] PT Assessment - 04/06/17 0001      Assessment   Medical Diagnosis chronic midline LBP   Onset Date/Surgical Date --  2-3 years ago, worse since 11/2016   Next MD Visit As needed   Prior Therapy noi     Precautions   Precautions None     Restrictions   Weight Bearing Restrictions No     Balance Screen   Has the patient fallen in the past 6 months No     Prior Function   Level of Independence Independent     Cognition   Overall Cognitive Status Within Functional Limits for tasks assessed     Observation/Other Assessments   Focus on Therapeutic Outcomes (FOTO)  31% limited     Posture/Postural Control   Posture Comments Thoracic incr kyphhosis  , rounded shoulders.  ,       ROM / Strength   AROM / PROM / Strength AROM;Strength     AROM   AROM Assessment Site Lumbar   Lumbar Flexion 55  Lumbar Extension 15   Lumbar - Right Side Bend 20   Lumbar - Left Side Bend 20     Strength   Overall Strength Comments LE WNL bilaterally     Flexibility   Soft Tissue Assessment /Muscle Length yes   Hamstrings 45 degrees bilaterally     Palpation   Palpation comment tender midline from lower thoracic to lower lumbar spine                                PT Long Term Goals - 04/06/17 1651      PT LONG TERM GOAL #1   Title She will be independent with all HEP issued   Time 3   Period Weeks   Status New               Plan - 04/06/17 1658    Clinical Impression Statement Ms Tremblay presents for low complexity eval with cronic back pain reported worse in past 4-5 months.  She has fair core strength and stiffness of spine and hips  with incr thoracic kyphosis.  She is generally worse with standing /activity on feet. PAin isvariable. She is going out of town for 2 months and will  only come 2-3 visits befoe leaving .    Rehab Potential Good   PT Frequency Other (comment)   PT Duration Other  (comment)  23- visits in next 2-3 weeks   PT Treatment/Interventions Passive range of motion;Patient/family education;Manual techniques;Therapeutic exercise   PT Next Visit Plan HEP   Consulted and Agree with Plan of Care Patient      Patient will benefit from skilled therapeutic intervention in order to improve the following deficits and impairments:  Pain, Postural dysfunction, Decreased strength, Decreased activity tolerance, Decreased range of motion, Increased muscle spasms  Visit Diagnosis: Chronic midline low back pain without sciatica  Joint stiffness of spine  Joint stiffness of both hips  Muscle spasm of back  Weakness of trunk musculature  Abnormal posture     Problem List Patient Active Problem List   Diagnosis Date Noted  . Acute midline low back pain without sciatica 02/24/2017  . Special screening for malignant neoplasms, colon 07/18/2011  . Benign neoplasm of colon 07/18/2011  . Mid back pain 10/23/2009  . CHEST PAIN, EXERTIONAL 10/23/2009  . ANEMIA-IRON DEFICIENCY 10/21/2007  . ALLERGIC RHINITIS 10/21/2007  . DIABETES MELLITUS, TYPE II 04/07/2007  . HYPERLIPIDEMIA 04/07/2007  . HYPERTENSION 04/07/2007  . DISEASE, HYPERTENSIVE HEART NOS, W/O HF 04/07/2007  . MENORRHAGIA 04/07/2007    Darrel Hoover  PT 04/06/2017, 5:02 PM  [facility] [address] [place], [place], [address] Phone: [phone]   Fax:  [phone]  Name: Derriona Branscom MRN: [id number] Date of Birth: July 06, 1956

## 2017-04-23 ENCOUNTER — Ambulatory Visit: Payer: 59 | Attending: Family

## 2017-04-23 DIAGNOSIS — M545 Low back pain: Secondary | ICD-10-CM | POA: Diagnosis not present

## 2017-04-23 DIAGNOSIS — M25651 Stiffness of right hip, not elsewhere classified: Secondary | ICD-10-CM | POA: Diagnosis not present

## 2017-04-23 DIAGNOSIS — M25652 Stiffness of left hip, not elsewhere classified: Secondary | ICD-10-CM

## 2017-04-23 DIAGNOSIS — M62830 Muscle spasm of back: Secondary | ICD-10-CM

## 2017-04-23 DIAGNOSIS — R293 Abnormal posture: Secondary | ICD-10-CM

## 2017-04-23 DIAGNOSIS — G8929 Other chronic pain: Secondary | ICD-10-CM | POA: Diagnosis not present

## 2017-04-23 DIAGNOSIS — M6281 Muscle weakness (generalized): Secondary | ICD-10-CM | POA: Diagnosis not present

## 2017-04-23 DIAGNOSIS — M2560 Stiffness of unspecified joint, not elsewhere classified: Secondary | ICD-10-CM | POA: Diagnosis not present

## 2017-04-23 NOTE — Therapy (Signed)
[place], [place], [address] Phone: [phone]   Fax:  [phone]  Physical Therapy Treatment/ Discharge  Patient Details  Name: Emma Davenport MRN: [id number] Date of Birth: 1956/05/13 No Data Recorded  Encounter Date: 04/23/2017      PT End of Session - 04/23/17 0843    Visit Number 2   Number of Visits 3   Authorization Type UMR   PT Start Time 0845   PT Stop Time 0930   PT Time Calculation (min) 45 min   Activity Tolerance Patient tolerated treatment well   Behavior During Therapy [facility] for tasks assessed/performed      Past Medical History:  Diagnosis Date  . Adenomatous polyp of colon 2005  . Allergy    allergic rhinitis  . Arthritis   . Diabetes mellitus 2002   T2DM  . Hyperlipidemia   . Hypertension   . Menorrhagia     Past Surgical History:  Procedure Laterality Date  . ABDOMINAL HYSTERECTOMY    . COLONOSCOPY    . CYSTOSCOPY    . POLYPECTOMY      There were no vitals filed for this visit.               Treatment: HEP and instruction in mechanics /posture                PT Education - 04/23/17 0929    Education provided Yes   Education Details HEP   Person(s) Educated Patient   Methods Explanation;Demonstration;Verbal cues;Handout;Tactile cues   Comprehension Verbalized understanding;Returned demonstration             PT Long Term Goals - 04/23/17 0929      PT LONG TERM GOAL #1   Title She will be independent with all HEP issued   Baseline she did al exer correct post instruction   Status Achieved               Plan - 04/23/17 1696    PT Treatment/Interventions Passive range of motion;Patient/family education;Manual techniques;Therapeutic exercise   Consulted and Agree with Plan of Care Patient      Patient will benefit from skilled therapeutic intervention in order to improve the following deficits and impairments:  Pain,  Postural dysfunction, Decreased strength, Decreased activity tolerance, Decreased range of motion, Increased muscle spasms  Visit Diagnosis: Chronic midline low back pain without sciatica  Joint stiffness of spine  Joint stiffness of both hips  Muscle spasm of back  Weakness of trunk musculature  Abnormal posture     Problem List Patient Active Problem List   Diagnosis Date Noted  . Acute midline low back pain without sciatica 02/24/2017  . Special screening for malignant neoplasms, colon 07/18/2011  . Benign neoplasm of colon 07/18/2011  . Mid back pain 10/23/2009  . CHEST PAIN, EXERTIONAL 10/23/2009  . ANEMIA-IRON DEFICIENCY 10/21/2007  . ALLERGIC RHINITIS 10/21/2007  . DIABETES MELLITUS, TYPE II 04/07/2007  . HYPERLIPIDEMIA 04/07/2007  . HYPERTENSION 04/07/2007  . DISEASE, HYPERTENSIVE HEART NOS, W/O HF 04/07/2007  . MENORRHAGIA 04/07/2007    Darrel Hoover 04/23/2017, 9:30 AM  [facility] [address] [place], [place], [address] Phone: [phone]   Fax:  [phone]  Name: Emma Davenport MRN: [id number] Date of Birth: [birth year] PHYSICAL THERAPY DISCHARGE SUMMARY  Visits from Start of Care: 2  Current functional level related to goals / functional outcomes: HEP established before trip out of country   Remaining  deficits: NA   Education / Equipment: HEP/ Mechaicsposture Plan: Patient agrees to discharge.  Patient goals were met. Patient is being discharged due to meeting the stated rehab goals.  ?????

## 2017-04-23 NOTE — Patient Instructions (Addendum)

## 2017-06-26 ENCOUNTER — Telehealth: Payer: Self-pay | Admitting: Pharmacist

## 2017-06-26 ENCOUNTER — Ambulatory Visit: Payer: Self-pay | Admitting: Pharmacist

## 2017-06-26 NOTE — Patient Outreach (Signed)
61 y.o. year old female referred to [facility] for Diabetes ([facility] to [facility] call, missed appointment)  Was unable to reach patient via telephone today and have left HIPAA compliant voicemail asking patient to return my call (unsuccessful outreach #1).  Plan: Will followup in 3 days via telephone  Carlean Jews, Pharm.D. PGY2 Ambulatory Care Pharmacy Resident Phone: [phone]

## 2017-08-03 ENCOUNTER — Other Ambulatory Visit: Payer: Self-pay

## 2017-08-03 VITALS — BP 104/60 | Ht 61.0 in | Wt 180.4 lb

## 2017-08-03 NOTE — Patient Outreach (Signed)
[facility]) Care Management   08/03/2017  [name] June 02, 1956 [id number]  Emma Davenport is an 61 y.o. female seen for follow-up for Link to Wellness for Type 2 Diabetes.   Subjective:  Patient states that she is having scheduling issues and is not getting three meals in daily. She states that she eats a very light breakfast in the morning of just one pancake and coffee with whole milk. She generally does not get a chance to eat lunch and feels hypoglycemic around noon. At this time, she usually treats her low sugars with apple juice or sweet tea. She states that there is no time for her to eat lunch and tends to only eat lunch once weekly. She is also working two jobs and most nights does not eat dinner until 9 pm. 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 In the past she was exercising at [facility] on a treadmill for around 30 minutes twice weekly. She since states that she has become more busy and has not had time to exercise at all.  Objective:   Review of Systems  All other systems reviewed and are negative.  Viewed glucometer readings as follows: 30 day average: 127 mg/dL 14 day average: 135 mg/dL 7 day average: 134 mg/dL  Physical Exam  Today's Vitals   08/03/17 1430  BP: 104/60  Weight: 180 lb 6.4 oz (81.8 kg)  Height: 1.549 m (5\' 1" )  PainSc: 0-No pain   Encounter Medications:   Outpatient Encounter Prescriptions as of 08/03/2017  Medication Sig Note  . aspirin 81 MG tablet Take 81 mg by mouth daily.     Marland Kitchen atorvastatin (LIPITOR) 20 MG tablet Take 20 mg by mouth every evening.   . fluocinonide cream (LIDEX) 0.05 %  08/03/2017: Rash as needed  . glipiZIDE (GLUCOTROL) 10 MG tablet Take 10 mg by mouth 2 (two) times daily before a meal.   . ibuprofen (ADVIL,MOTRIN) 200 MG tablet Take 200 mg by mouth daily as needed. Reported on 04/29/2016   . lisinopril-hydrochlorothiazide  (PRINZIDE,ZESTORETIC) 20-25 MG per tablet Take 1 tablet by mouth daily.   . metFORMIN (GLUCOPHAGE) 1000 MG tablet Take 1,000 mg by mouth 2 (two) times daily with a meal.   . pioglitazone (ACTOS) 45 MG tablet    . sitaGLIPtin (JANUVIA) 100 MG tablet Take 100 mg by mouth daily.   Marland Kitchen PRESCRIPTION MEDICATION Lotion Diprolene   . [DISCONTINUED] pioglitazone (ACTOS) 30 MG tablet Take 30 mg by mouth daily.   . [DISCONTINUED] predniSONE (DELTASONE) 10 MG tablet Take 1 tablet (10 mg total) by mouth daily with breakfast. (Patient not taking: Reported on 08/03/2017)    No facility-administered encounter medications on file as of 08/03/2017.     Functional Status:   In your present state of health, do you have any difficulty performing the following activities: 08/03/2017  Hearing? N  Vision? N  Difficulty concentrating or making decisions? N  Walking or climbing stairs? N  Dressing or bathing? N  Doing errands, shopping? N  Preparing Food and eating ? N  Using the Toilet? N  In the past six months, have you accidently leaked urine? N  Do you have problems with loss of bowel control? N  Managing your Medications? N  Managing your Finances? Y  Housekeeping or managing your Housekeeping? N  Some recent data might be hidden    Fall/Depression Screening:    Fall  Risk  08/03/2017 03/01/2015  Falls in the past year? No No   PHQ 2/9 Scores 08/03/2017 03/01/2015 02/25/2011  PHQ - 2 Score 0 0 0    Assessment: Patient is not meeting Diabetes self-management goal of hemoglobin A1c of 7. Her last hemoglobin A1c was 7.9% on 02/12/17. Patient is not eating enough protein in her meals, allowing for frequent hypoglycemia. Her work schedule limits her in carbohydrate intake and she does not follow a consistent meal schedule. She reports compliance with her medications. Up to date with annual eye exam but is in need of a dental follow-up. Not exercising regularly.  Fasting blood sugars are ranging from 63--130 mg/dL  and post-prandial blood sugars are ranging from the 130-200's mg/dL.   Plan:   Patient was educated to add protein to her breakfast and to always carry her glucose tablets and protein bars in her pocket. She was also instructed to take her blood sugar when she is experiencing any symptoms of low blood sugar. Instructed to exercise twice weekly, she agreed to attempt to walk outside for 30 minutes twice weekly.  Follow-up with Dr. Lina Sar care plan after her 08/20/17 appointment with him. Plan Link to Wellness follow-up within the next three months after primary care visit assessment.   [facility] CM Care Plan Problem One     Most Recent Value  Care Plan Problem One  Potential for elevated sugars related to diagnosis of Type 2 Diabetes  Role Documenting the Problem One  Clinical Pharmacist  Care Plan for Problem One  Active  THN Long Term Goal   Will lower hemoglobin A1c of 7 or below in 90 days  [facility]N Long Term Goal Start Date  08/03/17  Interventions for Problem One Long Term Goal  Plate method education, healthy eating choices, how to treat hypoglycemia      Diana L. Kyung Rudd, PharmD, [place] PGY1 Pharmacy Resident

## 2017-08-07 ENCOUNTER — Encounter: Payer: Self-pay | Admitting: *Deleted

## 2017-08-20 DIAGNOSIS — E08319 Diabetes mellitus due to underlying condition with unspecified diabetic retinopathy without macular edema: Secondary | ICD-10-CM | POA: Diagnosis not present

## 2017-08-20 DIAGNOSIS — Z0000 Encounter for general adult medical examination without abnormal findings: Secondary | ICD-10-CM | POA: Diagnosis not present

## 2017-08-20 DIAGNOSIS — E663 Overweight: Secondary | ICD-10-CM | POA: Diagnosis not present

## 2017-08-20 DIAGNOSIS — M5441 Lumbago with sciatica, right side: Secondary | ICD-10-CM | POA: Diagnosis not present

## 2017-08-20 DIAGNOSIS — E1065 Type 1 diabetes mellitus with hyperglycemia: Secondary | ICD-10-CM | POA: Diagnosis not present

## 2017-08-20 DIAGNOSIS — E7800 Pure hypercholesterolemia, unspecified: Secondary | ICD-10-CM | POA: Diagnosis not present

## 2017-08-20 DIAGNOSIS — I10 Essential (primary) hypertension: Secondary | ICD-10-CM | POA: Diagnosis not present

## 2017-08-21 MED FILL — glipiZIDE 10 MG TABS: 10 | 90 days supply | Qty: 180 | Fill #0

## 2017-08-21 MED FILL — JANUVIA 100 MG TABLET: 100 | 90 days supply | Qty: 90 | Fill #0

## 2017-08-21 MED FILL — metFORMIN HCL 1000 MG TABS: 1000 | 90 days supply | Qty: 180 | Fill #0

## 2017-08-21 MED FILL — PIOGLITAZONE HCL 45 MG TAB: 45 | 90 days supply | Qty: 90 | Fill #1

## 2017-08-21 MED FILL — LISINOPRIL-HCTZ 20-25 MG TA: 20-25 | 90 days supply | Qty: 90 | Fill #2

## 2017-08-21 MED FILL — ATORVASTATIN 20 MG TABLET: 20 | 90 days supply | Qty: 90 | Fill #0

## 2017-08-21 MED FILL — FLUOCINONIDE 0.05% CREAM: 0.05 | 30 days supply | Qty: 60 | Fill #1

## 2017-09-11 ENCOUNTER — Telehealth: Payer: Self-pay | Admitting: Pharmacist

## 2017-09-11 NOTE — Patient Outreach (Signed)
61 y.o. year old female referred to [facility] for Diabetes (LTW - Attempt to schedule f/u)   Was unable to reach patient via telephone today and have left HIPAA compliant voicemail asking patient to return my call (unsuccessful outreach #1).  Plan: Will followup in no more than 7 days via telephone  Carlean Jews, Pharm.D. PGY2 Ambulatory Care Pharmacy Resident Phone: [phone]

## 2017-09-14 ENCOUNTER — Telehealth: Payer: Self-pay

## 2017-09-14 ENCOUNTER — Ambulatory Visit: Payer: Self-pay

## 2017-09-14 NOTE — Patient Outreach (Signed)
Called Link to Wellness member to schedule a follow up appointment, there was no answer, left message to call me back. 

## 2017-09-15 ENCOUNTER — Telehealth: Payer: Self-pay | Admitting: Pharmacist

## 2017-09-15 NOTE — Patient Outreach (Signed)
Confirmed patient is now scheduled for LTW f/u on 10/26/2017.   Carlean Jews, Pharm.D. PGY2 Ambulatory Care Pharmacy Resident Phone: [phone]

## 2017-10-12 DIAGNOSIS — Z1231 Encounter for screening mammogram for malignant neoplasm of breast: Secondary | ICD-10-CM | POA: Diagnosis not present

## 2017-10-26 ENCOUNTER — Ambulatory Visit: Payer: Self-pay | Admitting: Pharmacist

## 2017-10-26 ENCOUNTER — Telehealth: Payer: Self-pay | Admitting: Pharmacist

## 2017-10-26 NOTE — Patient Outreach (Signed)
[facility]) Care Management  10/26/2017  Sharalyn Lomba 01-15-1956 [id number]   Called patient to inform her that disease self-management services will be transitioned from the Link To Wellness program to either [facility] or [facility] Health Management in 2019. Unable to reach patient, left HIPAA compliant VM requesting she return my call.   Will close case to Link To Wellness diabetes program and continue to reach patient.   Carlean Jews, Pharm.D., BCPS PGY2 Ambulatory Care Pharmacy Resident Phone: [phone]

## 2017-11-19 DIAGNOSIS — Z01419 Encounter for gynecological examination (general) (routine) without abnormal findings: Secondary | ICD-10-CM | POA: Diagnosis not present

## 2017-11-19 DIAGNOSIS — R87610 Atypical squamous cells of undetermined significance on cytologic smear of cervix (ASC-US): Secondary | ICD-10-CM | POA: Diagnosis not present

## 2017-11-19 DIAGNOSIS — Z6836 Body mass index (BMI) 36.0-36.9, adult: Secondary | ICD-10-CM | POA: Diagnosis not present

## 2017-12-04 MED FILL — ATORVASTATIN 20 MG TABLET: 20 | 90 days supply | Qty: 90 | Fill #1

## 2017-12-04 MED FILL — FLUOCINONIDE 0.05% CREAM: 0.05 | 30 days supply | Qty: 60 | Fill #2

## 2017-12-04 MED FILL — PIOGLITAZONE HCL 45 MG TAB: 45 | 90 days supply | Qty: 90 | Fill #2

## 2017-12-04 MED FILL — glipiZIDE 10 MG TABS: 10 | 90 days supply | Qty: 180 | Fill #1

## 2017-12-04 MED FILL — metFORMIN HCL 1000 MG TABS: 1000 | 90 days supply | Qty: 180 | Fill #1

## 2017-12-04 MED FILL — JANUVIA 100 MG TABLET: 100 | 90 days supply | Qty: 90 | Fill #1

## 2017-12-08 MED FILL — FREESTYLE LITE TEST STRIP: 90 days supply | Qty: 100 | Fill #0

## 2017-12-08 MED FILL — FREESTYLE LANCETS: 90 days supply | Qty: 100 | Fill #0

## 2017-12-09 MED FILL — LISINOPRIL-HCTZ 20-25 MG TA: 20-25 | 90 days supply | Qty: 90 | Fill #0

## 2018-02-18 DIAGNOSIS — E1165 Type 2 diabetes mellitus with hyperglycemia: Secondary | ICD-10-CM | POA: Diagnosis not present

## 2018-02-18 DIAGNOSIS — K635 Polyp of colon: Secondary | ICD-10-CM | POA: Diagnosis not present

## 2018-02-18 DIAGNOSIS — E7800 Pure hypercholesterolemia, unspecified: Secondary | ICD-10-CM | POA: Diagnosis not present

## 2018-02-18 DIAGNOSIS — E663 Overweight: Secondary | ICD-10-CM | POA: Diagnosis not present

## 2018-02-18 DIAGNOSIS — I10 Essential (primary) hypertension: Secondary | ICD-10-CM | POA: Diagnosis not present

## 2018-03-05 MED FILL — glipiZIDE 10 MG TABS: 10 | 90 days supply | Qty: 180 | Fill #2

## 2018-03-05 MED FILL — LISINOPRIL-HCTZ 20-25 MG TA: 20-25 | 90 days supply | Qty: 90 | Fill #1

## 2018-03-05 MED FILL — metFORMIN HCL 1000 MG TABS: 1000 | 90 days supply | Qty: 180 | Fill #2

## 2018-03-05 MED FILL — FREESTYLE LITE TEST STRIP: 90 days supply | Qty: 100 | Fill #1

## 2018-03-05 MED FILL — JANUVIA 100 MG TABLET: 100 | 90 days supply | Qty: 90 | Fill #2

## 2018-03-05 MED FILL — FREESTYLE LANCETS: 90 days supply | Qty: 100 | Fill #1

## 2018-03-08 MED FILL — PIOGLITAZONE HCL 45 MG TAB: 45 | 90 days supply | Qty: 90 | Fill #0

## 2018-03-30 DIAGNOSIS — H10413 Chronic giant papillary conjunctivitis, bilateral: Secondary | ICD-10-CM | POA: Diagnosis not present

## 2018-03-30 DIAGNOSIS — Z961 Presence of intraocular lens: Secondary | ICD-10-CM | POA: Diagnosis not present

## 2018-03-30 DIAGNOSIS — H35372 Puckering of macula, left eye: Secondary | ICD-10-CM | POA: Diagnosis not present

## 2018-03-30 DIAGNOSIS — E113553 Type 2 diabetes mellitus with stable proliferative diabetic retinopathy, bilateral: Secondary | ICD-10-CM | POA: Diagnosis not present

## 2018-03-30 DIAGNOSIS — H2511 Age-related nuclear cataract, right eye: Secondary | ICD-10-CM | POA: Diagnosis not present

## 2018-03-30 DIAGNOSIS — H40031 Anatomical narrow angle, right eye: Secondary | ICD-10-CM | POA: Diagnosis not present

## 2018-03-30 DIAGNOSIS — H1851 Endothelial corneal dystrophy: Secondary | ICD-10-CM | POA: Diagnosis not present

## 2018-03-30 DIAGNOSIS — H26492 Other secondary cataract, left eye: Secondary | ICD-10-CM | POA: Diagnosis not present

## 2018-05-24 DIAGNOSIS — H40031 Anatomical narrow angle, right eye: Secondary | ICD-10-CM | POA: Diagnosis not present

## 2018-05-25 DIAGNOSIS — Z7984 Long term (current) use of oral hypoglycemic drugs: Secondary | ICD-10-CM | POA: Diagnosis not present

## 2018-05-25 DIAGNOSIS — E1122 Type 2 diabetes mellitus with diabetic chronic kidney disease: Secondary | ICD-10-CM | POA: Diagnosis not present

## 2018-05-25 DIAGNOSIS — I10 Essential (primary) hypertension: Secondary | ICD-10-CM | POA: Diagnosis not present

## 2018-05-25 DIAGNOSIS — E08319 Diabetes mellitus due to underlying condition with unspecified diabetic retinopathy without macular edema: Secondary | ICD-10-CM | POA: Diagnosis not present

## 2018-05-25 DIAGNOSIS — Z6835 Body mass index (BMI) 35.0-35.9, adult: Secondary | ICD-10-CM | POA: Diagnosis not present

## 2018-05-25 DIAGNOSIS — E7800 Pure hypercholesterolemia, unspecified: Secondary | ICD-10-CM | POA: Diagnosis not present

## 2018-06-03 MED FILL — ATORVASTATIN CALCIUM 20 MG: 20 | 90 days supply | Qty: 90 | Fill #2

## 2018-06-03 MED FILL — FREESTYLE LITE TEST STRIP: 90 days supply | Qty: 100 | Fill #2

## 2018-06-03 MED FILL — glipiZIDE 10 MG TABS: 10 | 30 days supply | Qty: 60 | Fill #3

## 2018-06-03 MED FILL — metFORMIN HCL 1000 MG TABS: 1000 | 30 days supply | Qty: 60 | Fill #3

## 2018-06-03 MED FILL — LISINOPRIL-HCTZ 20-25 MG TA: 20-25 | 90 days supply | Qty: 90 | Fill #2

## 2018-06-04 MED FILL — JANUVIA 100 MG TABLET: 100 | 30 days supply | Qty: 30 | Fill #3

## 2018-06-07 DIAGNOSIS — H26492 Other secondary cataract, left eye: Secondary | ICD-10-CM | POA: Diagnosis not present

## 2018-06-07 DIAGNOSIS — H35372 Puckering of macula, left eye: Secondary | ICD-10-CM | POA: Diagnosis not present

## 2018-06-07 DIAGNOSIS — H1851 Endothelial corneal dystrophy: Secondary | ICD-10-CM | POA: Diagnosis not present

## 2018-06-07 DIAGNOSIS — H2511 Age-related nuclear cataract, right eye: Secondary | ICD-10-CM | POA: Diagnosis not present

## 2018-06-07 DIAGNOSIS — H40031 Anatomical narrow angle, right eye: Secondary | ICD-10-CM | POA: Diagnosis not present

## 2018-06-07 DIAGNOSIS — Z961 Presence of intraocular lens: Secondary | ICD-10-CM | POA: Diagnosis not present

## 2018-11-07 ENCOUNTER — Encounter: Payer: Self-pay | Admitting: Gastroenterology

## 2018-11-23 MED FILL — LISINOPRIL-HCTZ 20-25 MG TA: 20-25 | 90 days supply | Qty: 90 | Fill #3

## 2018-11-25 ENCOUNTER — Encounter: Payer: Self-pay | Admitting: Gastroenterology

## 2018-12-15 ENCOUNTER — Ambulatory Visit (AMBULATORY_SURGERY_CENTER): Payer: Self-pay | Admitting: *Deleted

## 2018-12-15 VITALS — Ht 61.0 in | Wt 182.0 lb

## 2018-12-15 DIAGNOSIS — Z86010 Personal history of colonic polyps: Secondary | ICD-10-CM

## 2018-12-15 MED ORDER — PEG 3350-KCL-NA BICARB-NACL 420 G PO SOLR: 4000.0000 mL | Freq: Once | ORAL | 0 refills | Status: AC

## 2018-12-15 NOTE — Progress Notes (Signed)
Patient denies any allergies to eggs or soy. Patient denies any problems with anesthesia/sedation. Patient denies any oxygen use at home. Patient denies taking any diet/weight loss medications or blood thinners. EMMI education assisgned to patient on colonoscopy, this was explained and instructions given to patient. 2 day prep given to pt due to constipation per pt.

## 2018-12-29 ENCOUNTER — Encounter: Payer: Self-pay | Admitting: Gastroenterology

## 2018-12-29 ENCOUNTER — Ambulatory Visit (AMBULATORY_SURGERY_CENTER): Payer: Medicare Other | Admitting: Gastroenterology

## 2018-12-29 VITALS — BP 139/39 | HR 83 | Temp 95.1°F | Resp 15 | Ht 61.0 in | Wt 182.0 lb

## 2018-12-29 DIAGNOSIS — Z860100 Personal history of colon polyps, unspecified: Secondary | ICD-10-CM

## 2018-12-29 DIAGNOSIS — D120 Benign neoplasm of cecum: Secondary | ICD-10-CM

## 2018-12-29 MED ORDER — SODIUM CHLORIDE 0.9 % IV SOLN: 500.0000 mL | Freq: Once | INTRAVENOUS | Status: DC

## 2018-12-29 NOTE — Progress Notes (Signed)
Called to room to assist during endoscopic procedure.  Patient ID and intended procedure confirmed with present staff. Received instructions for my participation in the procedure from the performing physician.  

## 2018-12-29 NOTE — Progress Notes (Signed)
Report to PACU, RN, vss, BBS= Clear.  

## 2018-12-29 NOTE — Patient Instructions (Signed)
YOU HAD AN ENDOSCOPIC PROCEDURE TODAY AT THE [facility]:   Refer to the procedure report that was given to you for any specific questions about what was found during the examination.  If the procedure report does not answer your questions, please call your gastroenterologist to clarify.  If you requested that your care partner not be given the details of your procedure findings, then the procedure report has been included in a sealed envelope for you to review at your convenience later.  YOU SHOULD EXPECT: Some feelings of bloating in the abdomen. Passage of more gas than usual.  Walking can help get rid of the air that was put into your GI tract during the procedure and reduce the bloating. If you had a lower endoscopy (such as a colonoscopy or flexible sigmoidoscopy) you may notice spotting of blood in your stool or on the toilet paper. If you underwent a bowel prep for your procedure, you may not have a normal bowel movement for a few days.  Please Note:  You might notice some irritation and congestion in your nose or some drainage.  This is from the oxygen used during your procedure.  There is no need for concern and it should clear up in a day or so.  SYMPTOMS TO REPORT IMMEDIATELY:   Following lower endoscopy (colonoscopy or flexible sigmoidoscopy):  Excessive amounts of blood in the stool  Significant tenderness or worsening of abdominal pains  Swelling of the abdomen that is new, acute  Fever of 100F or higher  Please see handouts given to you on Polyps.  For urgent or emergent issues, a gastroenterologist can be reached at any hour by calling [phone].   DIET:  We do recommend a small meal at first, but then you may proceed to your regular diet.  Drink plenty of fluids but you should avoid alcoholic beverages for 24 hours.  ACTIVITY:  You should plan to take it easy for the rest of today and you should NOT DRIVE or use heavy machinery until tomorrow (because of the  sedation medicines used during the test).    FOLLOW UP: Our staff will call the number listed on your records the next business day following your procedure to check on you and address any questions or concerns that you may have regarding the information given to you following your procedure. If we do not reach you, we will leave a message.  However, if you are feeling well and you are not experiencing any problems, there is no need to return our call.  We will assume that you have returned to your regular daily activities without incident.  If any biopsies were taken you will be contacted by phone or by letter within the next 1-3 weeks.  Please call us at [phone] if you have not heard about the biopsies in 3 weeks.    SIGNATURES/CONFIDENTIALITY: You and/or your care partner have signed paperwork which will be entered into your electronic medical record.  These signatures attest to the fact that that the information above on your After Visit Summary has been reviewed and is understood.  Full responsibility of the confidentiality of this discharge information lies with you and/or your care-partner.  Thank you for letting us take care of your healthcare needs today. 

## 2018-12-29 NOTE — Op Note (Signed)
[facility] Patient Name: Emma Davenport Procedure Date: 12/29/2018 8:51 AM MRN: [id number] Endoscopist: Milus Banister , MD Age: 63 Referring MD:  Date of Birth: [birth year] Gender: Female Account #: [id number] Procedure:                Colonoscopy Indications:              High risk colon cancer surveillance: Personal                            history of colonic polyps; Colonoscopy with Dr.                            Sharlett Iles 2012 "multiple polyps removed up to 2cm."                            No exact number of how many, what sites (at least                            one of those polyps was a TVA). He repeated                            colonoscopy 1 year later and no polyps were found.                            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 The patient's tolerance of                            previous anesthesia was also reviewed. The risks                            and benefits of the procedure and the sedation                            options and risks were discussed with the patient.                            All questions were answered, and informed consent  was obtained. Prior Anticoagulants: The patient has                            taken no previous anticoagulant or antiplatelet                            agents. ASA Grade Assessment: II - A patient with                            mild systemic disease. After reviewing the risks                            and benefits, the patient was deemed in                            satisfactory condition to undergo the procedure.                  After obtaining informed consent, the colonoscope                            was passed under direct vision. Throughout the                            procedure, the patient's blood pressure, pulse, and                            oxygen saturations were monitored continuously. The                            Colonoscope was introduced through the anus and                            advanced to the the cecum, identified by                            appendiceal orifice and ileocecal valve. The                            colonoscopy was performed without difficulty. The                            patient tolerated the procedure well. The quality                            of the bowel preparation was good. The ileocecal                            valve, appendiceal orifice, and rectum were                            photographed. Scope In: 8:55:22 AM Scope Out: 9:13:28 AM Scope Withdrawal Time: 0 hours 7 minutes 42 seconds  Total Procedure Duration: 0 hours 18 minutes 6 seconds  Findings:  A 3 mm polyp was found in the cecum. The polyp was                            sessile. The polyp was removed with a cold snare.                            Resection and retrieval were complete.                           The exam was otherwise without abnormality on                            direct and retroflexion views. Complications:            No immediate complications. Estimated blood loss:                            None. Estimated Blood Loss:     Estimated blood loss: none. Impression:               - One 3 mm polyp in the cecum, removed with a cold                            snare. Resected and retrieved.                           - The examination was otherwise normal on direct                            and retroflexion views. Recommendation:           - Patient has a contact number available for                            emergencies. The signs and symptoms of  potential                            delayed complications were discussed with the                            patient. Return to normal activities tomorrow.                            Written discharge instructions were provided to the                            patient.                           - Resume previous diet.                           - Continue present medications.                           You will receive a letter within 2-3 weeks with  the                            pathology results and my final recommendations.                           If the polyp(s) is proven to be 'pre-cancerous' on                            pathology, you will need repeat colonoscopy in 5                            years. Milus Banister, MD 12/29/2018 9:17:27 AM This report has been signed electronically.

## 2018-12-29 NOTE — Progress Notes (Signed)
Pt's states no medical or surgical changes since previsit or office visit. 

## 2018-12-30 ENCOUNTER — Telehealth: Payer: Self-pay | Admitting: *Deleted

## 2018-12-30 NOTE — Telephone Encounter (Signed)
  Follow up Call-  Call back number 12/29/2018  Post procedure Call Back phone  # [phone]  Permission to leave phone message Yes  Some recent data might be hidden    [facility]

## 2018-12-30 NOTE — Telephone Encounter (Signed)
  Follow up Call-  Call back number 12/29/2018  Post procedure Call Back phone  # [phone]  Permission to leave phone message Yes  Some recent data might be hidden     Patient questions:  Do you have a fever, pain , or abdominal swelling? No. Pain Score  0 *  Have you tolerated food without any problems? Yes.    Have you been able to return to your normal activities? Yes.    Do you have any questions about your discharge instructions: Diet   No. Medications  No. Follow up visit  No.  Do you have questions or concerns about your Care? No.  Actions: * If pain score is 4 or above: No action needed, pain <4.

## 2019-01-04 ENCOUNTER — Encounter: Payer: Self-pay | Admitting: Gastroenterology

## 2020-01-22 ENCOUNTER — Ambulatory Visit: Payer: Medicare Other | Attending: Internal Medicine

## 2020-01-22 DIAGNOSIS — Z23 Encounter for immunization: Secondary | ICD-10-CM

## 2020-01-22 NOTE — Progress Notes (Signed)
   Covid-19 Vaccination Clinic  Name:  Emma Davenport    MRN: [id number] DOB: 1956-04-28  01/22/2020  [name] was observed post Covid-19 immunization for 15 minutes without incident. She was provided with Vaccine Information Sheet and instruction to access the V-Safe system.   [name] was instructed to call 911 with any severe reactions post vaccine: Marland Kitchen Difficulty breathing  . Swelling of face and throat  . A fast heartbeat  . A bad rash all over body  . Dizziness and weakness   Immunizations Administered    Name Date Dose VIS Date Route   Pfizer COVID-19 Vaccine 01/22/2020  8:36 AM 0.3 mL 10/28/2019 Intramuscular   Manufacturer: [place]   Lot: [id number]   [place]: [id number]

## 2020-02-22 ENCOUNTER — Ambulatory Visit: Payer: Medicare Other | Attending: Internal Medicine

## 2020-02-22 DIAGNOSIS — Z23 Encounter for immunization: Secondary | ICD-10-CM

## 2020-02-22 NOTE — Progress Notes (Signed)
   Covid-19 Vaccination Clinic  Name:  Emma Davenport    MRN: [id number] DOB: Oct 10, 1956  02/22/2020  [name] was observed post Covid-19 immunization for 15 minutes without incident. She was provided with Vaccine Information Sheet and instruction to access the V-Safe system.   [name] was instructed to call 911 with any severe reactions post vaccine: Marland Kitchen Difficulty breathing  . Swelling of face and throat  . A fast heartbeat  . A bad rash all over body  . Dizziness and weakness   Immunizations Administered    Name Date Dose VIS Date Route   Pfizer COVID-19 Vaccine 02/22/2020  1:55 PM 0.3 mL 10/28/2019 Intramuscular   Manufacturer: [place]   Lot: [id number]   [place]: [id number]
